# Patient Record
Sex: Female | Born: 1940 | Race: White | Hispanic: No | Marital: Married | Smoking: Never smoker
Health system: Southern US, Community
[De-identification: ages and names within clinical notes are randomized; demographics above are authoritative.]

## PROBLEM LIST (undated history)

## (undated) DIAGNOSIS — R7303 Prediabetes: Secondary | ICD-10-CM

## (undated) DIAGNOSIS — K219 Gastro-esophageal reflux disease without esophagitis: Secondary | ICD-10-CM

## (undated) DIAGNOSIS — C801 Malignant (primary) neoplasm, unspecified: Secondary | ICD-10-CM

## (undated) DIAGNOSIS — E039 Hypothyroidism, unspecified: Secondary | ICD-10-CM

## (undated) DIAGNOSIS — F32A Depression, unspecified: Secondary | ICD-10-CM

## (undated) DIAGNOSIS — E876 Hypokalemia: Secondary | ICD-10-CM

## (undated) DIAGNOSIS — I10 Essential (primary) hypertension: Secondary | ICD-10-CM

## (undated) DIAGNOSIS — M810 Age-related osteoporosis without current pathological fracture: Secondary | ICD-10-CM

## (undated) DIAGNOSIS — I499 Cardiac arrhythmia, unspecified: Secondary | ICD-10-CM

## (undated) DIAGNOSIS — I4891 Unspecified atrial fibrillation: Secondary | ICD-10-CM

## (undated) DIAGNOSIS — IMO0002 Reserved for concepts with insufficient information to code with codable children: Secondary | ICD-10-CM

## (undated) DIAGNOSIS — M1990 Unspecified osteoarthritis, unspecified site: Secondary | ICD-10-CM

## (undated) DIAGNOSIS — E785 Hyperlipidemia, unspecified: Secondary | ICD-10-CM

## (undated) DIAGNOSIS — E871 Hypo-osmolality and hyponatremia: Secondary | ICD-10-CM

## (undated) DIAGNOSIS — T8859XA Other complications of anesthesia, initial encounter: Secondary | ICD-10-CM

## (undated) DIAGNOSIS — F329 Major depressive disorder, single episode, unspecified: Secondary | ICD-10-CM

## (undated) DIAGNOSIS — G8929 Other chronic pain: Secondary | ICD-10-CM

## (undated) DIAGNOSIS — D649 Anemia, unspecified: Secondary | ICD-10-CM

## (undated) HISTORY — PX: BREAST CYST EXCISION: SHX579

## (undated) HISTORY — PX: JOINT REPLACEMENT: SHX530

## (undated) HISTORY — PX: APPENDECTOMY: SHX54

## (undated) HISTORY — PX: CHOLECYSTECTOMY: SHX55

## (undated) HISTORY — PX: TENDON REPAIR: SHX5111

## (undated) HISTORY — PX: COLONOSCOPY: SHX174

---

## 1975-03-21 HISTORY — PX: BACK SURGERY: SHX140

## 1988-03-20 HISTORY — PX: ROTATOR CUFF REPAIR: SHX139

## 2000-03-20 HISTORY — PX: FOOT SURGERY: SHX648

## 2000-03-20 HISTORY — PX: OTHER SURGICAL HISTORY: SHX169

## 2006-09-06 ENCOUNTER — Ambulatory Visit: Payer: Self-pay

## 2007-12-06 ENCOUNTER — Encounter: Payer: Self-pay | Admitting: Orthopedic Surgery

## 2007-12-19 ENCOUNTER — Encounter: Payer: Self-pay | Admitting: Orthopedic Surgery

## 2008-02-18 ENCOUNTER — Encounter: Payer: Self-pay | Admitting: Orthopedic Surgery

## 2010-10-12 ENCOUNTER — Emergency Department: Payer: Self-pay | Admitting: *Deleted

## 2013-05-19 ENCOUNTER — Emergency Department: Payer: Self-pay | Admitting: Emergency Medicine

## 2013-05-19 LAB — COMPREHENSIVE METABOLIC PANEL
ALK PHOS: 85 U/L
ALT: 19 U/L (ref 12–78)
AST: 25 U/L (ref 15–37)
Albumin: 3.2 g/dL — ABNORMAL LOW (ref 3.4–5.0)
Anion Gap: 3 — ABNORMAL LOW (ref 7–16)
BUN: 10 mg/dL (ref 7–18)
Bilirubin,Total: 0.4 mg/dL (ref 0.2–1.0)
CHLORIDE: 98 mmol/L (ref 98–107)
CREATININE: 1.07 mg/dL (ref 0.60–1.30)
Calcium, Total: 7.9 mg/dL — ABNORMAL LOW (ref 8.5–10.1)
Co2: 30 mmol/L (ref 21–32)
EGFR (African American): 60 — ABNORMAL LOW
GFR CALC NON AF AMER: 51 — AB
GLUCOSE: 136 mg/dL — AB (ref 65–99)
Osmolality: 264 (ref 275–301)
Potassium: 3.4 mmol/L — ABNORMAL LOW (ref 3.5–5.1)
SODIUM: 131 mmol/L — AB (ref 136–145)
Total Protein: 7.2 g/dL (ref 6.4–8.2)

## 2013-05-19 LAB — URINALYSIS, COMPLETE
BLOOD: NEGATIVE
Bilirubin,UR: NEGATIVE
Glucose,UR: NEGATIVE mg/dL (ref 0–75)
KETONE: NEGATIVE
Nitrite: NEGATIVE
Ph: 6 (ref 4.5–8.0)
Protein: 30
RBC,UR: NONE SEEN /HPF (ref 0–5)
Specific Gravity: 1.023 (ref 1.003–1.030)
Squamous Epithelial: <1
WBC UR: 227 /HPF (ref 0–5)

## 2013-05-19 LAB — CBC
HCT: 34.6 % — ABNORMAL LOW (ref 35.0–47.0)
HGB: 11.6 g/dL — AB (ref 12.0–16.0)
MCH: 29.6 pg (ref 26.0–34.0)
MCHC: 33.7 g/dL (ref 32.0–36.0)
MCV: 88 fL (ref 80–100)
Platelet: 208 10*3/uL (ref 150–440)
RBC: 3.94 10*6/uL (ref 3.80–5.20)
RDW: 13.9 % (ref 11.5–14.5)
WBC: 9 10*3/uL (ref 3.6–11.0)

## 2013-05-21 LAB — URINE CULTURE

## 2013-07-13 ENCOUNTER — Emergency Department: Payer: Self-pay | Admitting: Emergency Medicine

## 2013-07-13 LAB — URINALYSIS, COMPLETE
BILIRUBIN, UR: NEGATIVE
Bacteria: NONE SEEN
Blood: NEGATIVE
Glucose,UR: NEGATIVE mg/dL (ref 0–75)
Ketone: NEGATIVE
Leukocyte Esterase: NEGATIVE
Nitrite: NEGATIVE
Ph: 6 (ref 4.5–8.0)
Protein: NEGATIVE
Specific Gravity: 1.014 (ref 1.003–1.030)
Squamous Epithelial: NONE SEEN

## 2013-07-13 LAB — COMPREHENSIVE METABOLIC PANEL
ALK PHOS: 80 U/L
AST: 28 U/L (ref 15–37)
Albumin: 3.2 g/dL — ABNORMAL LOW (ref 3.4–5.0)
Anion Gap: 6 — ABNORMAL LOW (ref 7–16)
BILIRUBIN TOTAL: 0.4 mg/dL (ref 0.2–1.0)
BUN: 9 mg/dL (ref 7–18)
CALCIUM: 8.3 mg/dL — AB (ref 8.5–10.1)
CHLORIDE: 103 mmol/L (ref 98–107)
CO2: 30 mmol/L (ref 21–32)
CREATININE: 0.88 mg/dL (ref 0.60–1.30)
GLUCOSE: 138 mg/dL — AB (ref 65–99)
Osmolality: 278 (ref 275–301)
POTASSIUM: 3.1 mmol/L — AB (ref 3.5–5.1)
SGPT (ALT): 18 U/L (ref 12–78)
SODIUM: 139 mmol/L (ref 136–145)
Total Protein: 6.9 g/dL (ref 6.4–8.2)

## 2013-07-13 LAB — CBC
HCT: 35.3 % (ref 35.0–47.0)
HGB: 11.7 g/dL — AB (ref 12.0–16.0)
MCH: 28.6 pg (ref 26.0–34.0)
MCHC: 33.1 g/dL (ref 32.0–36.0)
MCV: 86 fL (ref 80–100)
Platelet: 191 10*3/uL (ref 150–440)
RBC: 4.09 10*6/uL (ref 3.80–5.20)
RDW: 14 % (ref 11.5–14.5)
WBC: 8 10*3/uL (ref 3.6–11.0)

## 2013-08-09 ENCOUNTER — Inpatient Hospital Stay: Payer: Self-pay | Admitting: Internal Medicine

## 2013-08-09 LAB — COMPREHENSIVE METABOLIC PANEL
ALBUMIN: 3.4 g/dL (ref 3.4–5.0)
AST: 32 U/L (ref 15–37)
Alkaline Phosphatase: 75 U/L
Anion Gap: 12 (ref 7–16)
BILIRUBIN TOTAL: 0.7 mg/dL (ref 0.2–1.0)
BUN: 16 mg/dL (ref 7–18)
Calcium, Total: 8.7 mg/dL (ref 8.5–10.1)
Chloride: 105 mmol/L (ref 98–107)
Co2: 22 mmol/L (ref 21–32)
Creatinine: 0.78 mg/dL (ref 0.60–1.30)
EGFR (Non-African Amer.): >60
GLUCOSE: 116 mg/dL — AB (ref 65–99)
Osmolality: 280 (ref 275–301)
POTASSIUM: 3 mmol/L — AB (ref 3.5–5.1)
SGPT (ALT): 18 U/L (ref 12–78)
Sodium: 139 mmol/L (ref 136–145)
TOTAL PROTEIN: 7.1 g/dL (ref 6.4–8.2)

## 2013-08-09 LAB — URINALYSIS, COMPLETE
Bacteria: NONE SEEN
Bilirubin,UR: NEGATIVE
GLUCOSE, UR: NEGATIVE mg/dL (ref 0–75)
NITRITE: NEGATIVE
PH: 5 (ref 4.5–8.0)
Protein: 30
RBC,UR: 8 /HPF (ref 0–5)
SPECIFIC GRAVITY: 1.026 (ref 1.003–1.030)
WBC UR: 6 /HPF (ref 0–5)

## 2013-08-09 LAB — CBC
HCT: 41.7 % (ref 35.0–47.0)
HGB: 13.3 g/dL (ref 12.0–16.0)
MCH: 27.8 pg (ref 26.0–34.0)
MCHC: 32 g/dL (ref 32.0–36.0)
MCV: 87 fL (ref 80–100)
PLATELETS: 218 10*3/uL (ref 150–440)
RBC: 4.78 10*6/uL (ref 3.80–5.20)
RDW: 14.4 % (ref 11.5–14.5)
WBC: 8.5 10*3/uL (ref 3.6–11.0)

## 2013-08-09 LAB — TROPONIN I: Troponin-I: 0.06 ng/mL — ABNORMAL HIGH

## 2013-08-09 LAB — MAGNESIUM: MAGNESIUM: 1.9 mg/dL

## 2013-08-10 LAB — BASIC METABOLIC PANEL
ANION GAP: 5 — AB (ref 7–16)
BUN: 14 mg/dL (ref 7–18)
Calcium, Total: 7.7 mg/dL — ABNORMAL LOW (ref 8.5–10.1)
Chloride: 113 mmol/L — ABNORMAL HIGH (ref 98–107)
Co2: 23 mmol/L (ref 21–32)
Creatinine: 0.87 mg/dL (ref 0.60–1.30)
EGFR (Non-African Amer.): >60
GLUCOSE: 79 mg/dL (ref 65–99)
Osmolality: 281 (ref 275–301)
POTASSIUM: 2.8 mmol/L — AB (ref 3.5–5.1)
Sodium: 141 mmol/L (ref 136–145)

## 2013-08-10 LAB — TROPONIN I
Troponin-I: 0.1 ng/mL — ABNORMAL HIGH
Troponin-I: 0.09 ng/mL — ABNORMAL HIGH

## 2013-08-10 LAB — CBC WITH DIFFERENTIAL/PLATELET
Basophil #: 0 10*3/uL (ref 0.0–0.1)
Basophil %: 0.3 %
Eosinophil #: 0.1 10*3/uL (ref 0.0–0.7)
Eosinophil %: 0.7 %
HCT: 37.2 % (ref 35.0–47.0)
HGB: 11.9 g/dL — ABNORMAL LOW (ref 12.0–16.0)
Lymphocyte #: 2 10*3/uL (ref 1.0–3.6)
Lymphocyte %: 27.8 %
MCH: 28 pg (ref 26.0–34.0)
MCHC: 32 g/dL (ref 32.0–36.0)
MCV: 87 fL (ref 80–100)
Monocyte #: 0.6 x10 3/mm (ref 0.2–0.9)
Monocyte %: 8.5 %
Neutrophil #: 4.6 10*3/uL (ref 1.4–6.5)
Neutrophil %: 62.7 %
Platelet: 199 10*3/uL (ref 150–440)
RBC: 4.25 10*6/uL (ref 3.80–5.20)
RDW: 14.4 % (ref 11.5–14.5)
WBC: 7.3 10*3/uL (ref 3.6–11.0)

## 2013-08-10 LAB — DRUG SCREEN, URINE
Amphetamines, Ur Screen: NEGATIVE (ref ?–1000)
Barbiturates, Ur Screen: NEGATIVE (ref ?–200)
Benzodiazepine, Ur Scrn: NEGATIVE (ref ?–200)
Cannabinoid 50 Ng, Ur ~~LOC~~: NEGATIVE (ref ?–50)
Cocaine Metabolite,Ur ~~LOC~~: NEGATIVE (ref ?–300)
MDMA (Ecstasy)Ur Screen: NEGATIVE (ref ?–500)
Methadone, Ur Screen: NEGATIVE (ref ?–300)
Opiate, Ur Screen: POSITIVE (ref ?–300)
Phencyclidine (PCP) Ur S: NEGATIVE (ref ?–25)
Tricyclic, Ur Screen: POSITIVE (ref ?–1000)

## 2013-08-10 LAB — LIPID PANEL
Cholesterol: 160 mg/dL (ref 0–200)
HDL Cholesterol: 41 mg/dL (ref 40–60)
LDL CHOLESTEROL, CALC: 100 mg/dL (ref 0–100)
Triglycerides: 94 mg/dL (ref 0–200)
VLDL Cholesterol, Calc: 19 mg/dL (ref 5–40)

## 2013-08-10 LAB — POTASSIUM: Potassium: 3.3 mmol/L — ABNORMAL LOW (ref 3.5–5.1)

## 2013-08-10 LAB — TSH: Thyroid Stimulating Horm: 0.21 u[IU]/mL — ABNORMAL LOW

## 2013-08-11 LAB — URINE CULTURE

## 2013-08-14 LAB — CULTURE, BLOOD (SINGLE)

## 2013-08-20 ENCOUNTER — Inpatient Hospital Stay: Payer: Self-pay | Admitting: Internal Medicine

## 2013-08-20 LAB — COMPREHENSIVE METABOLIC PANEL
ALK PHOS: 89 U/L
Albumin: 3.4 g/dL (ref 3.4–5.0)
Anion Gap: 9 (ref 7–16)
BILIRUBIN TOTAL: 0.6 mg/dL (ref 0.2–1.0)
BUN: 12 mg/dL (ref 7–18)
Calcium, Total: 8.6 mg/dL (ref 8.5–10.1)
Chloride: 97 mmol/L — ABNORMAL LOW (ref 98–107)
Co2: 25 mmol/L (ref 21–32)
Creatinine: 1.29 mg/dL (ref 0.60–1.30)
EGFR (African American): 47 — ABNORMAL LOW
EGFR (Non-African Amer.): 41 — ABNORMAL LOW
GLUCOSE: 137 mg/dL — AB (ref 65–99)
Osmolality: 265 (ref 275–301)
Potassium: 4.3 mmol/L (ref 3.5–5.1)
SGOT(AST): 31 U/L (ref 15–37)
SGPT (ALT): 29 U/L (ref 12–78)
SODIUM: 131 mmol/L — AB (ref 136–145)
TOTAL PROTEIN: 7.2 g/dL (ref 6.4–8.2)

## 2013-08-20 LAB — PHOSPHORUS: PHOSPHORUS: 4 mg/dL (ref 2.5–4.9)

## 2013-08-20 LAB — PROTIME-INR
INR: 1
Prothrombin Time: 12.8 secs (ref 11.5–14.7)

## 2013-08-20 LAB — CBC WITH DIFFERENTIAL/PLATELET
Basophil #: 0.1 10*3/uL (ref 0.0–0.1)
Basophil %: 0.3 %
EOS PCT: 0.2 %
Eosinophil #: 0 10*3/uL (ref 0.0–0.7)
HCT: 41.9 % (ref 35.0–47.0)
HGB: 14 g/dL (ref 12.0–16.0)
Lymphocyte #: 2.3 10*3/uL (ref 1.0–3.6)
Lymphocyte %: 11.2 %
MCH: 28.9 pg (ref 26.0–34.0)
MCHC: 33.5 g/dL (ref 32.0–36.0)
MCV: 86 fL (ref 80–100)
Monocyte #: 1 x10 3/mm — ABNORMAL HIGH (ref 0.2–0.9)
Monocyte %: 4.9 %
Neutrophil #: 17.4 10*3/uL — ABNORMAL HIGH (ref 1.4–6.5)
Neutrophil %: 83.4 %
Platelet: 288 10*3/uL (ref 150–440)
RBC: 4.85 10*6/uL (ref 3.80–5.20)
RDW: 14.5 % (ref 11.5–14.5)
WBC: 20.9 10*3/uL — AB (ref 3.6–11.0)

## 2013-08-20 LAB — URINALYSIS, COMPLETE
BACTERIA: NONE SEEN
BLOOD: NEGATIVE
Bilirubin,UR: NEGATIVE
GLUCOSE, UR: NEGATIVE mg/dL (ref 0–75)
Hyaline Cast: 3
KETONE: NEGATIVE
Leukocyte Esterase: NEGATIVE
NITRITE: NEGATIVE
PH: 8 (ref 4.5–8.0)
Protein: NEGATIVE
RBC,UR: 1 /HPF (ref 0–5)
Specific Gravity: 1.016 (ref 1.003–1.030)
Squamous Epithelial: NONE SEEN

## 2013-08-20 LAB — TROPONIN I: Troponin-I: <0.02 ng/mL

## 2013-08-20 LAB — TSH: THYROID STIMULATING HORM: 0.14 u[IU]/mL — AB

## 2013-08-20 LAB — MAGNESIUM: Magnesium: 1.9 mg/dL

## 2013-08-21 LAB — CBC WITH DIFFERENTIAL/PLATELET
BASOS ABS: 0.1 10*3/uL (ref 0.0–0.1)
Basophil %: 0.3 %
Eosinophil #: 0 10*3/uL (ref 0.0–0.7)
Eosinophil %: 0 %
HCT: 39 % (ref 35.0–47.0)
HGB: 12.6 g/dL (ref 12.0–16.0)
Lymphocyte #: 1.1 10*3/uL (ref 1.0–3.6)
Lymphocyte %: 5.1 %
MCH: 28.2 pg (ref 26.0–34.0)
MCHC: 32.2 g/dL (ref 32.0–36.0)
MCV: 87 fL (ref 80–100)
MONOS PCT: 6.3 %
Monocyte #: 1.4 x10 3/mm — ABNORMAL HIGH (ref 0.2–0.9)
Neutrophil #: 19.4 10*3/uL — ABNORMAL HIGH (ref 1.4–6.5)
Neutrophil %: 88.3 %
Platelet: 187 10*3/uL (ref 150–440)
RBC: 4.46 10*6/uL (ref 3.80–5.20)
RDW: 14.7 % — ABNORMAL HIGH (ref 11.5–14.5)
WBC: 21.9 10*3/uL — AB (ref 3.6–11.0)

## 2013-08-21 LAB — BASIC METABOLIC PANEL
Anion Gap: 7 (ref 7–16)
BUN: 15 mg/dL (ref 7–18)
CHLORIDE: 103 mmol/L (ref 98–107)
CREATININE: 1.3 mg/dL (ref 0.60–1.30)
Calcium, Total: 7.3 mg/dL — ABNORMAL LOW (ref 8.5–10.1)
Co2: 23 mmol/L (ref 21–32)
GFR CALC AF AMER: 47 — AB
GFR CALC NON AF AMER: 40 — AB
Glucose: 165 mg/dL — ABNORMAL HIGH (ref 65–99)
Osmolality: 271 (ref 275–301)
POTASSIUM: 3.7 mmol/L (ref 3.5–5.1)
SODIUM: 133 mmol/L — AB (ref 136–145)

## 2013-08-21 LAB — T4, FREE: Free Thyroxine: 1.63 ng/dL — ABNORMAL HIGH (ref 0.76–1.46)

## 2013-08-22 LAB — URINE CULTURE

## 2013-08-25 LAB — CULTURE, BLOOD (SINGLE)

## 2013-09-04 ENCOUNTER — Ambulatory Visit: Payer: Self-pay | Admitting: Neurology

## 2014-03-20 HISTORY — PX: DENTAL SURGERY: SHX609

## 2014-07-11 NOTE — Discharge Summary (Signed)
PATIENT NAME:  Sara Greene, Sara Greene MR#:  [id number] DATE OF BIRTH:  November 06, 1940  DATE OF ADMISSION:  08/20/2013 DATE OF DISCHARGE:  08/21/2013  ADMISSION DIAGNOSIS: Sepsis.   DISCHARGE DIAGNOSES: 1.  Sepsis meeting criteria, but no source of infection.  2.  Leukocytosis, likely stress related and reactive.  3.  Hyponatremia.  4.  Diarrhea.  5.  Confusion.  6.  Hypothyroidism.   CONSULTATIONS: None.   DIAGNOSTIC DATA: Laboratory: Blood cultures were negative. Urine culture was negative.   Sodium 133, potassium 3.7, chloride 103, bicarb 23, BUN 15, creatinine 1.30, glucose 165. White blood cells 21, hemoglobin 12.6, hematocrit 39, and platelets 287,000.   HOSPITAL COURSE: The patient is a 74 year old female who presented with altered mental status, described as somnolence. No other associated symptoms. For further details, please refer to the H and P.  1.  Sepsis. The patient presented with a low-grade temperature, elevated heart rate and elevated white blood cell count. Her blood cultures and urine cultures were all ordered, all of which were negative. Her UA was also normal. She was placed on antibiotics empirically. Her white blood cell count did go up; however, this is likely secondary to inflammatory process. Her neutrophils were completely normal. I suspect that her sepsis is probably from constipation along with her altered mental status. She was discharged as per the family recommendations for empiric antibiotics, but no source of infection was noted.  2.  Hypernatremia from constipation, p.o. intake. The patient's sodium improved. 3.  Hypothyroidism. Her Synthroid was just recently changed and decreased so I did not make any changes. She will need repeat TFTs in 4 to 6 weeks. 4.  Diarrhea. The patient after using MOM now has some loose stools, and she was given loperamide for this, but the patient initially presented with constipation.  5.  Leukocytosis. This is suspected to be reactive in  nature.   DISCHARGE MEDICATIONS: 1.  Oxybutynin 5 mg daily. 2.  Pravastatin 40 mg daily. 3.  Doxepin 50 mg daily.  4.  Gabapentin 600 mg daily.  5.  Paxil 30 mg daily.  6.  Morphine 30 mg b.i.d.  7.  Ranitidine 150 mg daily.  8.  Omeprazole 20 mg daily. 9.  Calcium plus D 1 tablet daily. 10.  Centrum Silver 1 tablet daily.  11.  Acetaminophen/oxycodone 325/10 four times a day p.r.n.  12.  Synthroid 125 mcg daily.  13.  Lisinopril 5 mg daily.  14.  K-Tab 10 mEq daily. 15.  Aspirin 81 mg daily.  16.  Amoxicillin 1 tablet b.i.d. for 5 days.   DISCHARGE DIET: Low sodium.   DISCHARGE ACTIVITY: As tolerated.   DISCHARGE FOLLOWUP: The patient has a followup on 08/26/2013 with neurology and she will follow up at the end of the week with her primary care physician for repeat CBC as well as she will need thyroid function tests in about 4 weeks.  The patient was stable for discharge. Plan of care was discussed with the family.   TIME SPENT: 35 minutes.    ____________________________ Donell Beers. Benjie Karvonen, MD spm:sb D: 08/21/2013 12:33:44 ET T: 08/21/2013 13:40:57 ET JOB#: [id number]  cc: Matteus Mcnelly P. Benjie Karvonen, MD, <Dictator> Cash Manuella Ghazi, MD Donell Beers Erby Sanderson MD ELECTRONICALLY SIGNED 08/22/2013 12:35

## 2014-07-11 NOTE — H&P (Signed)
PATIENT NAME:  Sara Greene, BRIER MR#:  [id number] DATE OF BIRTH:  02-27-1941  DATE OF ADMISSION:  08/20/2013  REFERRING PHYSICIAN: Paduchowski.   PRIMARY CARE PHYSICIAN: [facility] in [place]: Altered mental status.   HISTORY OF PRESENT ILLNESS: A 74 year old Caucasian female with history of hypertension and hyperlipidemia, presenting with altered mental status. She is unable to provide any meaningful information given current mental status and medical condition. Family at bedside states 1 day duration of altered mental status, described mainly as lethargy and somnolence without associated symptoms, including chest pain, palpitations, shortness of breath, cough, fevers, chills, dysuria, diarrhea. She was recently discharged from [facility] with discharge diagnosis of encephalopathy, most likely secondary to medications which were addressed. She was also noted to have a urinary tract infection with Strep agalactiae or group B strep.   REVIEW OF SYSTEMS: Unobtainable given the patient's mental status and medical condition.   PAST MEDICAL HISTORY: Drug-induced lupus, hypothyroidism, hyperlipidemia, hypertension, depression.   SOCIAL HISTORY: No documentation of alcohol, tobacco or drug usage.   FAMILY HISTORY: Positive for CVA, diabetes, as well as coronary artery disease.   ALLERGIES: LIPITOR AND SHELLFISH.   HOME MEDICATIONS: Include acetaminophen/oxycodone 325/10 mg p.o. 4 times daily as needed for pain, morphine 30 mg p.o. b.i.d., gabapentin 600 mg p.o. daily, paroxetine 30 mg p.o. daily, fluconazole 100 mg p.o. 3 times daily (she has recently finished this course), pravastatin 40 mg p.o. daily, doxepin 50 mg p.o. daily, ranitidine 150 mg p.o. daily, potassium 10 mEq p.o. b.i.d., Prilosec 20 mg p.o. daily, levothyroxine 125 mcg p.o. daily, oxybutynin 5 mg p.o. daily, calcium 500 plus D 1 tablet daily, multivitamin 1 tablet daily.   PHYSICAL EXAMINATION:  VITAL SIGNS:  Temperature 100.6 degrees Fahrenheit, heart rate 127, respirations 16, blood pressure on arrival 72/42, currently 99/54, saturating 97% on room air.  GENERAL: Well-nourished Caucasian female, currently in moderate distress secondary to mental status.  HEAD: Normocephalic, atraumatic.  EYES: Pupils equal, round and reactive to light. Extraocular muscles intact. No scleral icterus.  MOUTH: Dry mucosal membranes. Dentition intact. No abscess noted.  EARS, NOSE, THROAT: Clear without exudates.  No external lesions.  NECK: Supple. No thyromegaly. No nodules. No JVD.   PULMONARY: Clear to auscultation bilaterally without wheezes, rubs or rhonchi. No use of accessory muscles. Good respiratory effort.  CHEST: Nontender to palpation.  CARDIOVASCULAR: S1, S2, tachycardic. No murmurs, rubs or gallops. No edema. Pedal pulses 2+ bilaterally.   GASTROINTESTINAL: Soft, nontender, nondistended. No masses. Positive bowel sounds. No hepatosplenomegaly.   MUSCULOSKELETAL: No swelling, clubbing, edema. Range of motion full in all extremities.  NEUROLOGIC: Unable to fully assess given the patient's current mental status. She is easily arousable to verbal stimuli; however, falls back to sleep. Unable to follow commands in any significant capacity. She does have spontaneous movements of all extremities.  SKIN: No ulcerations, lesions, rash or cyanosis. Skin warm, dry. Turgor intact.  PSYCHIATRIC: Unable to fully assess given the patient's current mental status and medical condition. She is rather somnolent. She does awake to verbal stimuli; however, unable to have a meaningful conversation.   LABORATORY DATA: Sodium 131, potassium 4.3, chloride 97, bicarb 25, BUN 12, creatinine 1.29, glucose 137. LFTs within normal limits. Troponin less than 0.02. WBC 20.9, hemoglobin 14, platelets of 288. Urinalysis negative for evidence of infection. Chest x-ray performed, reveals no acute cardiopulmonary process.   ASSESSMENT AND  PLAN: A 74 year old Caucasian female with history of hypertension and hyperlipidemia, presenting  with altered mental status.  1. Sepsis: Meeting septic criteria by temperature, heart rate and leukocytosis. Panculture, including blood and urine. Antibiotic coverage with vancomycin and Zosyn. Follow culture data. Downgrade antibiotics when able to. Intravenous fluid hydration to keep mean arterial pressure greater than 65.  2. Hyponatremia: Intravenous fluid hydration with normal saline. Follow sodium levels.  3. Hypothyroidism: Check a TSH, as well as continue with Synthroid.  4. Gastroesophageal reflux disease: Continue with proton pump inhibitor therapy.  5. Venous thromboembolism prophylaxis with heparin subcutaneous.   The patient is DNR as discussed with the husband and daughter at bedside.   TIME SPENT: 45 minutes.   ____________________________ Aaron Mose. Hower, MD dkh:gb D: 08/20/2013 58:59:29 ET T: 08/20/2013 23:51:37 ET JOB#: [id number]  cc: Aaron Mose. Hower, MD, <Dictator> DAVID Woodfin Ganja MD ELECTRONICALLY SIGNED 08/21/2013 1:17

## 2014-07-11 NOTE — H&P (Signed)
PATIENT NAME:  Sara, Greene MR#:  [id number] DATE OF BIRTH:  03-Jan-1941  DATE OF ADMISSION:  08/09/2013  CHIEF COMPLAINT: "I was brought here by my family."   HISTORY OF PRESENT ILLNESS: This is a 74 year old female who has refused to come to the ER for the past 2 days, was brought in by her family. She states that she feels ad .This started on Wednesday where she was not talking. She was not taking her medications. On Thursday she did not eat or drink anything. On Friday she could not finish her thought. They did get a small amount of Gatorade in her. They did give her to 2 aspirin last night. She has been feeling very cold and she had a fever at home of 99.4. In the ER she does answer questions. Hospitalist services were contacted for further evaluation.   Of note, the patient is on chronic pain medication and her last Percocet was on Wednesday, and unable to get any other pain medications or other medications in her. She also does take Paxil and doxepin.   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   PAST SURGICAL HISTORY: Right foot construction surgery, back surgery, cholecystectomy, appendectomy, rotator cuff surgery.   ALLERGIES: Lipitor.   MEDICATIONS: As per patient's list include levothyroxine 137 mcg daily, oxybutynin 5 mg daily, atenolol 50 mg daily, pravastatin 40 mg daily, doxepin 50 mg daily, gabapentin 600 mg daily, Paxil 30 mg daily, alendronate 70 mg once a week, morphine ER, MS Contin 30 mg b.i.d., ranitidine 150 mg daily, omeprazole 20 mg daily, Percocet 5/325 as needed for pain, calcium carbonate with vitamin D twice a day, Centrum Silver daily.   SOCIAL HISTORY: No smoking. No alcohol. No drug use. Used to work on a farm, in a day care, Optometrist, Network engineer.   FAMILY HISTORY: Father died of a CVA, also had skin cancer. Mother died of a myocardial infarction, had diabetes.   REVIEW OF  SYSTEMS: CONSTITUTIONAL: Positive for fever. Positive for chills. Positive for sweats. No weight loss. No weight gain. No weakness or fatigue.  EYES: No blurry vision.  Ears, nose, mouth and throat: No hearing loss. No sore throat. No difficulty swallowing.  CARDIOVASCULAR: No chest pain. No palpitations.  RESPIRATORY: No shortness of breath. No cough.  GASTROINTESTINAL: Positive for dry heaves, poor appetite. No abdominal pain. Positive for constipation, but today she did have a bowel movement. No bright red blood per rectum. No melena.  GENITOURINARY: No burning on urination. No hematuria.  MUSCULOSKELETAL: Positive for chronic pain in the low back.  INTEGUMENT: No rashes or eruptions.  NEUROLOGIC: No fainting or blackouts.  PSYCHIATRIC: Positive for anxiety and depression.  ENDOCRINE: Positive for hypothyroidism.  Hematologic and lymphatic: No anemia.   PHYSICAL EXAMINATION:  VITAL SIGNS: Temperature 97.5, pulse 62, respirations 20, blood pressure 164/74, pulse oximetry 100% on room air.  GENERAL: No respiratory distress.  EYES: Conjunctivae and lids normal. Pupils equal, round, and reactive to light. Extraocular muscles intact. No nystagmus.  Ears, nose, mouth and throat: Tympanic membranes: No erythema. Nasal mucosa: No erythema. Throat: No erythema. No exudate seen. Lips and gums: No lesions.  NECK: No JVD. No bruits. No lymphadenopathy. No thyromegaly. No thyroid nodules palpated.  LUNGS: Clear to auscultation. No use of accessory muscles to breathe. No rhonchi, rales, or wheeze heard.  CARDIOVASCULAR: S1, S2 normal. No gallops, rubs, or murmurs heard. Carotid upstroke 2+ bilaterally. No bruits. Dorsalis pedis pulses 2+ bilaterally.  Edema 1+ of the lower extremity.  ABDOMEN: Soft, nontender. No organosplenomegaly. Normoactive bowel sounds. No masses felt.  LYMPHATIC: No lymph nodes in the neck.  MUSCULOSKELETAL: No clubbing. Trace edema. No cyanosis.  SKIN: No ulcers or lesions seen.   NEUROLOGIC: Cranial nerves II through XII grossly intact. Deep tendon reflexes 2+ bilateral lower extremities.  PSYCHIATRIC: The patient is oriented to person, slight confusion and slight slurring of words.   LABORATORY AND RADIOLOGICAL DATA: Urinalysis 1+ leukocyte esterase. Chest x-ray negative. Troponin borderline at 0.06. White blood cell count 8.5, hemoglobin and hematocrit 13.3 and 41.7, platelet count of 218. Glucose 116, BUN 16, creatinine 0.78, sodium 139, potassium 3.0, chloride 105, CO2 of 22, calcium 8.7. Liver function tests normal range.   ASSESSMENT AND PLAN:  1.  Acute encephalopathy. I do think this is probably a withdrawal from the stopping of pain medications and her selective serotonin reuptake inhibitor abruptly. I will give a stat dose of Paxil 30 mg and continue that daily. We will give a dose of stat IV morphine 2 mg IV stat and continue her Percocet as needed and MS Contin at a lower dose. This could also be infection-related with a slightly positive urinalysis. The patient has been on Cipro recently. I will give IV Rocephin and send off a urine culture. Could also be a stroke. We will get a stat CT scan of the head: Family gave aspirin yesterday. We will continue aspirin on a daily basis. Could also be dehydration. We will give IV fluid hydration and continue to monitor her mental status. Can consider an MRI of the brain if the patient's symptoms do not improve by tomorrow.  2.  Hypokalemia. Will check a magnesium and replace that if low. We will replace potassium orally and in IV fluids.  3.  Poor appetite with history of esophagitis as per family. Will start IV Protonix and give empiric Diflucan to see if this improves the patient's appetite. The patient did have a recent endoscopy.  4.  Hypertension. Heart rate on the lower side. Will stop atenolol. Continue to monitor off medication at this point. Can consider Norvasc or another agent if blood pressure remains high.  5.   Hypothyroidism. Restart levothyroxine and check a TSH in the a.m.  6.  Hyperlipidemia. Hold statin at this point.  7.  Elevated troponin. Will monitor on telemetry. Continue aspirin and check serial cardiac enzymes. I think this is likely secondary to other processes going on rather than cardiac issue.  The patient is a DO NOT RESUSCITATE.  Time Spent on admission: 55 minutes.    ____________________________ Tana Conch. Leslye Peer, MD rjw:de D: 08/09/2013 18:49:43 ET T: 08/09/2013 20:47:58 ET JOB#: [id number]  cc: Tana Conch. Leslye Peer, MD, <Dictator> Marisue Brooklyn MD ELECTRONICALLY SIGNED 08/10/2013 10:27

## 2014-07-11 NOTE — Discharge Summary (Signed)
PATIENT NAME:  Sara Greene, Sara Greene MR#:  [id number] DATE OF BIRTH:  [birth year]  DATE OF ADMISSION:  08/09/2013 DATE OF DISCHARGE:  08/10/2013  DISCHARGE DIAGNOSES:  1. Acute encephalopathy, now resolved, possibly due to pain medicine withdrawal or too thyroid replacement. Certainly, could have onset of dementia.  2. Hypokalemia, repleted.  3. Poor appetite with history of esophagitis. Started on Diflucan empirically for thrush.  4. Bradycardia. Stopped atenolol. Started on lisinopril for blood pressure control.  5. Hypothyroidism with low TSH. Cutting back on Synthroid dose. Recommend rechecking TSH in 4 to 6 weeks as an outpatient.  6. Elevated troponin due to supply-demand ischemia.   SECONDARY DIAGNOSES:  1. Lupus from Lipitor.  2. Hypothyroidism.  3. Hyperlipidemia.  4. Hypertension.  5. Chronic pain.  6. Depression.   CONSULTATION: None.   PROCEDURES AND RADIOLOGY: Chest x-ray on the 23rd of May showed no acute cardiopulmonary disease.   CT scan of the head without contrast on the 23rd of May showed no acute abnormality.   MAJOR LABORATORY PANEL: UA on admission showed 6 WBCs and 1+ leuk esterase, otherwise negative.   Blood cultures x 2 were negative.   Urine culture grew 5000 colonies of gram-positive cocci.   HISTORY AND SHORT HOSPITAL COURSE: The patient is a 74 year old female with above-mentioned medical problems who was admitted for some altered mental status, mainly some difficulty remembering things. Please see Dr. Glade Stanford dictated history and physical for further details. There was concern for possible urinary tract infection. She also had low potassium which was repleted. She was started on IV antibiotics, although her urine culture remained negative so antibiotics were stopped. On further lab work evaluation, she was found to have a low TSH suggestive over replacement of thyroid hormone, and her Synthroid dose was reduced. The patient was also found to have  borderline troponin which was thought to be due to supply-demand ischemia. She was found to be bradycardic for which atenolol was stopped and was started on lisinopril for better blood pressure control. After discussion with family, the patient was thought to be back to her baseline. They also recommended/requested a new primary care physician which was assigned to her and was discharged home in stable condition.   VITAL SIGNS: On the date of discharge, her vital signs were as follows: Temperature 98.4, heart rate 55 per minute, respirations 20 per minute, blood pressure 146/72 mmHg. She was saturating 98% on room air.   PERTINENT PHYSICAL EXAMINATION ON THE DATE OF DISCHARGE:  CARDIOVASCULAR: S1, S2 normal. No murmurs, rubs or gallop.  LUNGS: Clear to auscultation bilaterally. No wheezing, rales, rhonchi or crepitation.  ABDOMEN: Soft, benign.  NEUROLOGIC: Nonfocal examination.   All other physical examination remained at baseline.   DISCHARGE MEDICATIONS:  1. Oxybutynin 5 mg p.o. daily.  2. Pravastatin 40 mg p.o. daily.  3. Doxepin 50 mg p.o. daily.  4. Gabapentin 600 mg p.o. daily.  5. Paroxetine 30 mg p.o. daily.  6. Morphine 30 mg/12 hour 1 tablet p.o. b.i.d.  7. Ranitidine 150 mg p.o. daily.  8. Omeprazole 20 mg p.o. daily.  9. Calcium with vitamin D 1 tablet p.o. daily.  10. Centrum Silver once daily.  11. Acetaminophen/oxycodone 325/10 mg 1 tablet p.o. 4 times a day as needed.  12. Levothyroxine 125 mcg p.o. daily.  13. Diflucan 100 mg p.o. daily for 3 more days.  14. Lisinopril 5 mg p.o. daily.  15. KDur tablet 10 mEq p.o. b.i.d.   DISCHARGE DIET: Low sodium.  DISCHARGE ACTIVITY: As tolerated.   DISCHARGE INSTRUCTIONS AND FOLLOWUP:  1. The patient was instructed to follow up with her new primary care physician, Dr. Deborra Medina or Ronette Deter, at [facility] in [place] where she can see at earliest available appointment.  2. The patient will need to follow  up with [facility], Dr. Jennings Books, in 2 to 4 weeks and with pain management doctor as scheduled in coming weeks.   TOTAL TIME DISCHARGING THIS PATIENT: 55 minutes.   ____________________________ Lucina Mellow. Manuella Ghazi, MD vss:gb D: 08/10/2013 16:21:46 ET T: 08/11/2013 02:25:33 ET JOB#: [id number]  cc: Mintie Witherington S. Manuella Ghazi, MD, <Dictator> Deborra Medina, MD Hemang K. Manuella Ghazi, [place] MD ELECTRONICALLY SIGNED 08/12/2013 20:55

## 2014-07-31 ENCOUNTER — Other Ambulatory Visit: Payer: Self-pay | Admitting: Family Medicine

## 2014-07-31 DIAGNOSIS — Z1231 Encounter for screening mammogram for malignant neoplasm of breast: Secondary | ICD-10-CM

## 2014-08-12 ENCOUNTER — Ambulatory Visit
Admission: RE | Admit: 2014-08-12 | Discharge: 2014-08-12 | Disposition: A | Discharge status: Home / Self Care | Payer: Medicare Other | Source: Ambulatory Visit | Attending: Family Medicine | Admitting: Family Medicine

## 2014-08-12 ENCOUNTER — Other Ambulatory Visit: Payer: Self-pay | Admitting: Family Medicine

## 2014-08-12 DIAGNOSIS — Z1231 Encounter for screening mammogram for malignant neoplasm of breast: Secondary | ICD-10-CM

## 2014-10-14 ENCOUNTER — Encounter: Payer: Self-pay | Admitting: *Deleted

## 2014-10-15 ENCOUNTER — Ambulatory Visit: Payer: Medicare Other | Admitting: Anesthesiology

## 2014-10-15 ENCOUNTER — Encounter
Admission: RE | Disposition: A | Discharge status: Home / Self Care | Payer: Self-pay | Source: Ambulatory Visit | Attending: Gastroenterology

## 2014-10-15 ENCOUNTER — Ambulatory Visit
Admission: RE | Admit: 2014-10-15 | Discharge: 2014-10-15 | Disposition: A | Discharge status: Home / Self Care | Payer: Medicare Other | Source: Ambulatory Visit | Attending: Gastroenterology | Admitting: Gastroenterology

## 2014-10-15 DIAGNOSIS — I10 Essential (primary) hypertension: Secondary | ICD-10-CM | POA: Diagnosis not present

## 2014-10-15 DIAGNOSIS — E039 Hypothyroidism, unspecified: Secondary | ICD-10-CM | POA: Diagnosis not present

## 2014-10-15 DIAGNOSIS — Z91013 Allergy to seafood: Secondary | ICD-10-CM | POA: Diagnosis not present

## 2014-10-15 DIAGNOSIS — E785 Hyperlipidemia, unspecified: Secondary | ICD-10-CM | POA: Insufficient documentation

## 2014-10-15 DIAGNOSIS — Z888 Allergy status to other drugs, medicaments and biological substances status: Secondary | ICD-10-CM | POA: Diagnosis not present

## 2014-10-15 DIAGNOSIS — M810 Age-related osteoporosis without current pathological fracture: Secondary | ICD-10-CM | POA: Diagnosis not present

## 2014-10-15 DIAGNOSIS — Z79891 Long term (current) use of opiate analgesic: Secondary | ICD-10-CM | POA: Diagnosis not present

## 2014-10-15 DIAGNOSIS — Z79899 Other long term (current) drug therapy: Secondary | ICD-10-CM | POA: Diagnosis not present

## 2014-10-15 DIAGNOSIS — I4891 Unspecified atrial fibrillation: Secondary | ICD-10-CM | POA: Diagnosis not present

## 2014-10-15 DIAGNOSIS — Z1211 Encounter for screening for malignant neoplasm of colon: Secondary | ICD-10-CM | POA: Insufficient documentation

## 2014-10-15 HISTORY — DX: Major depressive disorder, single episode, unspecified: F32.9

## 2014-10-15 HISTORY — DX: Age-related osteoporosis without current pathological fracture: M81.0

## 2014-10-15 HISTORY — DX: Essential (primary) hypertension: I10

## 2014-10-15 HISTORY — DX: Hyperlipidemia, unspecified: E78.5

## 2014-10-15 HISTORY — PX: COLONOSCOPY WITH PROPOFOL: SHX5780

## 2014-10-15 HISTORY — DX: Depression, unspecified: F32.A

## 2014-10-15 HISTORY — DX: Gastro-esophageal reflux disease without esophagitis: K21.9

## 2014-10-15 HISTORY — DX: Hypothyroidism, unspecified: E03.9

## 2014-10-15 HISTORY — DX: Cardiac arrhythmia, unspecified: I49.9

## 2014-10-15 SURGERY — COLONOSCOPY WITH PROPOFOL
Anesthesia: General

## 2014-10-15 MED ORDER — ONDANSETRON HCL 4 MG/2ML IJ SOLN
INTRAMUSCULAR | Status: AC
  Filled 2014-10-15: qty 2

## 2014-10-15 MED ORDER — PROPOFOL 10 MG/ML IV BOLUS: INTRAVENOUS | Status: DC | PRN

## 2014-10-15 MED ORDER — PROPOFOL 10 MG/ML IV BOLUS
INTRAVENOUS | Status: DC | PRN
  Administered 2014-10-15 (×5): 20 mg via INTRAVENOUS

## 2014-10-15 MED ORDER — MIDAZOLAM HCL 2 MG/2ML IJ SOLN
INTRAMUSCULAR | Status: DC | PRN
  Administered 2014-10-15: 1 mg via INTRAVENOUS

## 2014-10-15 MED ORDER — SODIUM CHLORIDE 0.9 % IV SOLN
INTRAVENOUS | Status: DC
Start: 2014-10-15 — End: 2014-10-15
  Administered 2014-10-15: 1000 mL via INTRAVENOUS

## 2014-10-15 MED ORDER — ONDANSETRON HCL 4 MG/2ML IJ SOLN: 4.0000 mg | Freq: Once | INTRAMUSCULAR | Status: DC

## 2014-10-15 NOTE — H&P (Signed)
  Date of Initial H&P:10/14/2014  History reviewed, patient examined, no change in status, stable for surgery.

## 2014-10-15 NOTE — Anesthesia Preprocedure Evaluation (Signed)
Anesthesia Evaluation  Patient identified by MRN, date of birth, ID band Patient awake    Reviewed: Allergy & Precautions, H&P , NPO status , Patient's Chart, lab work & pertinent test results, reviewed documented beta blocker date and time   Airway Mallampati: II  TM Distance: >3 FB Neck ROM: full    Dental no notable dental hx.    Pulmonary neg pulmonary ROS,  breath sounds clear to auscultation  Pulmonary exam normal       Cardiovascular Exercise Tolerance: Good hypertension, negative cardio ROS  + dysrhythmias Atrial Fibrillation Rhythm:regular Rate:Normal     Neuro/Psych negative neurological ROS  negative psych ROS   GI/Hepatic negative GI ROS, Neg liver ROS, GERD-  ,  Endo/Other  negative endocrine ROSHypothyroidism   Renal/GU negative Renal ROS  negative genitourinary   Musculoskeletal   Abdominal   Peds  Hematology negative hematology ROS (+)   Anesthesia Other Findings   Reproductive/Obstetrics negative OB ROS                             Anesthesia Physical Anesthesia Plan  ASA: III  Anesthesia Plan: General   Post-op Pain Management:    Induction:   Airway Management Planned:   Additional Equipment:   Intra-op Plan:   Post-operative Plan:   Informed Consent: I have reviewed the patients History and Physical, chart, labs and discussed the procedure including the risks, benefits and alternatives for the proposed anesthesia with the patient or authorized representative who has indicated his/her understanding and acceptance.   Dental Advisory Given  Plan Discussed with: CRNA  Anesthesia Plan Comments:         Anesthesia Quick Evaluation

## 2014-10-15 NOTE — Op Note (Signed)
[facility] Patient Name: Sara Greene Procedure Date: 10/15/2014 10:46 AM MRN: [id number] Account #: [id number] Date of Birth: [birth year] Admit Type: Outpatient Age: 74 Room: [facility] ENDO ROOM 4 Gender: Female Note Status: Finalized Procedure:         Colonoscopy Indications:       Screening for colorectal malignant neoplasm Providers:         Lupita Dawn. Candace Cruise, MD Referring MD:      Dion Body (Referring MD) Medicines:         Monitored Anesthesia Care Complications:     No immediate complications. Procedure:         Pre-Anesthesia Assessment:                    - Prior to the procedure, a History and Physical was                     performed, and patient medications, allergies and                     sensitivities were reviewed. The patient's tolerance of                     previous anesthesia was reviewed.                    - The risks and benefits of the procedure and the sedation                     options and risks were discussed with the patient. All                     questions were answered and informed consent was obtained.                    - After reviewing the risks and benefits, the patient was                     deemed in satisfactory condition to undergo the procedure.                    After obtaining informed consent, the colonoscope was                     passed under direct vision. Throughout the procedure, the                     patient's blood pressure, pulse, and oxygen saturations                     were monitored continuously. The Colonoscope was                     introduced through the anus and advanced to the the cecum,                     identified by appendiceal orifice and ileocecal valve. The                     colonoscopy was performed without difficulty. The patient                     tolerated the procedure well. The quality of the bowel  preparation was fair. Findings:      The  colon (entire examined portion) appeared normal. Impression:        - The entire examined colon is normal.                    - No specimens collected. Recommendation:    - The findings and recommendations were discussed with the                     patient.                    - Discharge patient to home.                    - The findings and recommendations were discussed with the                     patient. Procedure Code(s): --- Professional ---                    [phone], Colonoscopy, flexible; diagnostic, including                     collection of specimen(s) by brushing or washing, when                     performed (separate procedure) Diagnosis Code(s): --- Professional ---                    Z12.11, Encounter for screening for malignant neoplasm of                     colon CPT copyright 2014 American Medical Association. All rights reserved. The codes documented in this report are preliminary and upon coder review may  be revised to meet current compliance requirements. Hulen Luster, MD 10/15/2014 11:09:24 AM This report has been signed electronically. Number of Addenda: 0 Note Initiated On: 10/15/2014 10:46 AM Scope Withdrawal Time: 0 hours 8 minutes 50 seconds  Total Procedure Duration: 0 hours 12 minutes 40 seconds       [facility]

## 2014-10-15 NOTE — Transfer of Care (Signed)
Immediate Anesthesia Transfer of Care Note  Patient: Sara Greene  Procedure(s) Performed: Procedure(s): COLONOSCOPY WITH PROPOFOL (N/A)  Patient Location: PACU and Endoscopy Unit  Anesthesia Type:General  Level of Consciousness: awake  Airway & Oxygen Therapy: Patient Spontanous Breathing  Post-op Assessment: Report given to RN  Post vital signs: stable  Last Vitals:  Filed Vitals:   10/15/14 1109  BP: 114/72  Pulse: 67  Temp: 36.4 C  Resp: 16    Complications: No apparent anesthesia complications

## 2014-10-15 NOTE — Anesthesia Postprocedure Evaluation (Signed)
  Anesthesia Post-op Note  Patient: Sales promotion account executive  Procedure(s) Performed: Procedure(s): COLONOSCOPY WITH PROPOFOL (N/A)  Anesthesia type:General  Patient location: PACU  Post pain: Pain level controlled  Post assessment: Post-op Vital signs reviewed, Patient's Cardiovascular Status Stable, Respiratory Function Stable, Patent Airway and No signs of Nausea or vomiting  Post vital signs: Reviewed and stable  Last Vitals:  Filed Vitals:   10/15/14 1109  BP: 114/72  Pulse: 67  Temp: 36.4 C  Resp: 16    Level of consciousness: awake, alert  and patient cooperative  Complications: No apparent anesthesia complications

## 2014-10-16 ENCOUNTER — Encounter: Payer: Self-pay | Admitting: Gastroenterology

## 2015-03-08 ENCOUNTER — Encounter: Payer: Self-pay | Admitting: Emergency Medicine

## 2015-03-08 ENCOUNTER — Emergency Department: Payer: Medicare Other

## 2015-03-08 ENCOUNTER — Emergency Department
Admission: EM | Admit: 2015-03-08 | Discharge: 2015-03-08 | Disposition: A | Discharge status: Home / Self Care | Payer: Medicare Other | Attending: Emergency Medicine | Admitting: Emergency Medicine

## 2015-03-08 DIAGNOSIS — R2241 Localized swelling, mass and lump, right lower limb: Secondary | ICD-10-CM | POA: Diagnosis not present

## 2015-03-08 DIAGNOSIS — Z79899 Other long term (current) drug therapy: Secondary | ICD-10-CM | POA: Insufficient documentation

## 2015-03-08 DIAGNOSIS — I10 Essential (primary) hypertension: Secondary | ICD-10-CM | POA: Insufficient documentation

## 2015-03-08 DIAGNOSIS — G8929 Other chronic pain: Secondary | ICD-10-CM | POA: Insufficient documentation

## 2015-03-08 DIAGNOSIS — M25561 Pain in right knee: Secondary | ICD-10-CM | POA: Insufficient documentation

## 2015-03-08 DIAGNOSIS — Z7982 Long term (current) use of aspirin: Secondary | ICD-10-CM | POA: Diagnosis not present

## 2015-03-08 DIAGNOSIS — Z79891 Long term (current) use of opiate analgesic: Secondary | ICD-10-CM | POA: Diagnosis not present

## 2015-03-08 MED ORDER — KETOROLAC TROMETHAMINE 30 MG/ML IJ SOLN
30.0000 mg | Freq: Once | INTRAMUSCULAR | Status: AC
  Administered 2015-03-08: 30 mg via INTRAMUSCULAR
  Filled 2015-03-08: qty 1

## 2015-03-08 NOTE — ED Provider Notes (Signed)
Time Seen: Approximately 11:15 I have reviewed the triage notes  Chief Complaint: Knee Pain   History of Present Illness: Sara Greene is a 74 y.o. female who presents with some persistent right knee pain since December 14. She states that she has not had any acute injury though noticed it at that time. He said intermittent sharp pain along with right knee swelling. She states the pain is worse when walking up stairs or ambulatory for significant periods of time. He states at times the pain is severe and she cannot bear weight on her right leg. She is on chronic pain medication for chronic back discomfort and is seen by [facility] in [place]. She morning she went to the [facility] and was referred here for further evaluation. She's noticed some mild swelling diffusely in her right lower extremity. She denies any history of deep venous thrombosis or pulmonary embolism. Past Medical History  Diagnosis Date  . Hypertension   . Hypothyroidism   . Dysrhythmia     a fib  . Osteoporosis   . Depression   . Hyperlipidemia   . GERD (gastroesophageal reflux disease)     There are no active problems to display for this patient.   Past Surgical History  Procedure Laterality Date  . Cholecystectomy    . Back surgery    . Colonoscopy    . Foot surgery    . Appendectomy    . Colonoscopy with propofol N/A 10/15/2014    Procedure: COLONOSCOPY WITH PROPOFOL;  Surgeon: Hulen Luster, MD;  Location: [facility] ENDOSCOPY;  Service: Gastroenterology;  Laterality: N/A;    Past Surgical History  Procedure Laterality Date  . Cholecystectomy    . Back surgery    . Colonoscopy    . Foot surgery    . Appendectomy    . Colonoscopy with propofol N/A 10/15/2014    Procedure: COLONOSCOPY WITH PROPOFOL;  Surgeon: Hulen Luster, MD;  Location: [facility] ENDOSCOPY;  Service: Gastroenterology;  Laterality: N/A;    Current Outpatient Rx  Name  Route  Sig  Dispense  Refill  . aspirin EC 81 MG tablet   Oral   Take  81 mg by mouth daily.         . calcium-vitamin D (OSCAL WITH D) 500-200 MG-UNIT per tablet   Oral   Take 2 tablets by mouth daily.          Marland Kitchen doxepin (SINEQUAN) 50 MG capsule   Oral   Take 50 mg by mouth at bedtime.         Marland Kitchen esomeprazole (NEXIUM) 20 MG capsule   Oral   Take 20 mg by mouth every morning.          . gabapentin (NEURONTIN) 600 MG tablet   Oral   Take 600 mg by mouth every morning.          Marland Kitchen levothyroxine (SYNTHROID, LEVOTHROID) 112 MCG tablet   Oral   Take 112 mcg by mouth daily before breakfast.         . lisinopril (PRINIVIL,ZESTRIL) 5 MG tablet   Oral   Take 5 mg by mouth daily.         Marland Kitchen morphine (MS CONTIN) 30 MG 12 hr tablet   Oral   Take 30 mg by mouth every 12 (twelve) hours.         . Multiple Vitamins-Minerals (CENTRUM SILVER ADULT 50+ PO)   Oral   Take 1 tablet by mouth daily.          Marland Kitchen  omeprazole (PRILOSEC) 20 MG capsule   Oral   Take 20 mg by mouth daily.         Marland Kitchen oxybutynin (DITROPAN) 5 MG tablet   Oral   Take 5 mg by mouth 2 (two) times daily.          Marland Kitchen oxyCODONE-acetaminophen (PERCOCET) 10-325 MG per tablet   Oral   Take 1 tablet by mouth 3 (three) times daily as needed for pain.          Marland Kitchen PARoxetine (PAXIL) 30 MG tablet   Oral   Take 30 mg by mouth every morning.          . potassium chloride (K-DUR,KLOR-CON) 10 MEQ tablet   Oral   Take 10 mEq by mouth daily.          . pravastatin (PRAVACHOL) 40 MG tablet   Oral   Take 40 mg by mouth at bedtime.            Allergies:  Lipitor and Shellfish allergy  Family History: History reviewed. No pertinent family history.  Social History: Social History  Substance Use Topics  . Smoking status: Never Smoker   . Smokeless tobacco: None  . Alcohol Use: None     Review of Systems:   Constitutional: No fever. Patient denies any other joint pain or trauma Eyes: No visual disturbances ENT: No sore throat, ear pain Cardiac: No chest  pain Respiratory: No shortness of breath, wheezing, or stridor Abdomen: No abdominal pain, no vomiting, No diarrhea EExtremities: Patient noted edema exclusively in the right lower extremity cyanosis Skin: No rashes, easy bruising Neurologic: No focal weakness, trouble with speech or swollowing   Physical Exam:  ED Triage Vitals  Enc Vitals Group     BP 03/08/15 0849 126/79 mmHg     Pulse Rate 03/08/15 0849 97     Resp 03/08/15 0849 18     Temp 03/08/15 0849 98.5 F (36.9 C)     Temp Source 03/08/15 0849 Oral     SpO2 03/08/15 0849 100 %     Weight 03/08/15 0849 193 lb (87.544 kg)     Height 03/08/15 0849 5\' 5"  (1.651 m)     Head Cir --      Peak Flow --      Pain Score 03/08/15 0857 8     Pain Loc --      Pain Edu? --      Excl. in [place]? --     General: Awake , Alert , and Oriented times 3; GCS 15 Head: Normal cephalic , atraumatic Eyes: Pupils equal , round, reactive to light Nose/Throat: No nasal drainage, patent upper airway without erythema or exudate.  Neck: Supple, Full range of motion, No anterior adenopathy or palpable thyroid masses Lungs: Clear to ascultation without wheezes , rhonchi, or rales Heart: Regular rate, regular rhythm without murmurs , gallops , or rubs Abdomen: Soft, non tender without rebound, guarding , or rigidity; bowel sounds positive and symmetric in all 4 quadrants. No organomegaly .        Extremities: Examination of the right knee shows it to be stable to Lachman testing. Negative anterior posterior drawer sign. She has reproducible pain over the lateral surface of her joint region. No significant joint effusion, erythema noted. The patient has some mild crepitus underneath the kneecap with palpation but is able to straighten her leg out against resistance. No instability to varus or valgus rotation. Extremities otherwise neurovascularly intact with a  negative Homans sign Neurologic: normal ambulation, Motor symmetric without deficits, sensory  intact Skin: warm, dry, no rashes    Radiology:   CLINICAL DATA: Right knee pain since walking while shopping on 03/03/2015. Initial encounter.  EXAM: RIGHT KNEE - 1-2 VIEW  COMPARISON: None.  FINDINGS: No acute bony or joint abnormality is identified. No joint effusion is seen. Chondrocalcinosis is noted. Small osteophytes are present about the knee.  IMPRESSION: No acute abnormality.  Mild appearing degenerative change.  Chondrocalcinosis.   Electronically Signed By: Inge Rise M.D. On: 03/08/2015 11:23          US Venous Img Lower Unilateral Right (Final result) Result time: 03/08/15 10:02:09   Final result by Rad Results In Interface (03/08/15 10:02:09)   Narrative:   CLINICAL DATA: Right lower extremity pain and edema for the past 5 days. Evaluate for DVT.  EXAM: RIGHT LOWER EXTREMITY VENOUS DOPPLER ULTRASOUND  TECHNIQUE: Gray-scale sonography with graded compression, as well as color Doppler and duplex ultrasound were performed to evaluate the lower extremity deep venous systems from the level of the common femoral vein and including the common femoral, femoral, profunda femoral, popliteal and calf veins including the posterior tibial, peroneal and gastrocnemius veins when visible. The superficial great saphenous vein was also interrogated. Spectral Doppler was utilized to evaluate flow at rest and with distal augmentation maneuvers in the common femoral, femoral and popliteal veins.  COMPARISON: None.  FINDINGS: Contralateral Common Femoral Vein: Respiratory phasicity is normal and symmetric with the symptomatic side. No evidence of thrombus. Normal compressibility.  Common Femoral Vein: No evidence of thrombus. Normal compressibility, respiratory phasicity and response to augmentation.  Saphenofemoral Junction: No evidence of thrombus. Normal compressibility and flow on color Doppler imaging.  Profunda Femoral Vein: No  evidence of thrombus. Normal compressibility and flow on color Doppler imaging.  Femoral Vein: No evidence of thrombus. Normal compressibility, respiratory phasicity and response to augmentation.  Popliteal Vein: No evidence of thrombus. Normal compressibility, respiratory phasicity and response to augmentation.  Calf Veins: No evidence of thrombus. Normal compressibility and flow on color Doppler imaging.  Superficial Great Saphenous Vein: No evidence of thrombus. Normal compressibility and flow on color Doppler imaging.  Venous Reflux: None.  Other Findings: None.  IMPRESSION: No evidence of DVT within the right lower extremity.        I personally reviewed the radiologic studies     ED Course:  Patient does not appear to have a DVT per ultrasound evaluation and clinically I felt she either had a meniscus injury or osteochondritis. She was given a knee immobilizer and crutches and advised continue with her pain management follow-up and was referred here locally to orthopedics unassigned.    Assessment:  Acute right knee pain    Final Clinical Impression:   Final diagnoses:  Knee pain, acute, right     Plan:  Outpatient management Patient was advised to return immediately if condition worsens. Patient was advised to follow up with their primary care physician or other specialized physicians involved in their outpatient care             Daymon Larsen, MD 03/08/15 1551

## 2015-03-08 NOTE — Discharge Instructions (Signed)
Knee Pain Knee pain is a very common symptom and can have many causes. Knee pain often goes away when you follow your health care provider's instructions for relieving pain and discomfort at home. However, knee pain can develop into a condition that needs treatment. Some conditions may include:  Arthritis caused by wear and tear (osteoarthritis).  Arthritis caused by swelling and irritation (rheumatoid arthritis or gout).  A cyst or growth in your knee.  An infection in your knee joint.  An injury that will not heal.  Damage, swelling, or irritation of the tissues that support your knee (torn ligaments or tendinitis). If your knee pain continues, additional tests may be ordered to diagnose your condition. Tests may include X-rays or other imaging studies of your knee. You may also need to have fluid removed from your knee. Treatment for ongoing knee pain depends on the cause, but treatment may include:  Medicines to relieve pain or swelling.  Steroid injections in your knee.  Physical therapy.  Surgery. HOME CARE INSTRUCTIONS  Take medicines only as directed by your health care provider.  Rest your knee and keep it raised (elevated) while you are resting.  Do not do things that cause or worsen pain.  Avoid high-impact activities or exercises, such as running, jumping rope, or doing jumping jacks.  Apply ice to the knee area:  Put ice in a plastic bag.  Place a towel between your skin and the bag.  Leave the ice on for 20 minutes, 2-3 times a day.  Ask your health care provider if you should wear an elastic knee support.  Keep a pillow under your knee when you sleep.  Lose weight if you are overweight. Extra weight can put pressure on your knee.  Do not use any tobacco products, including cigarettes, chewing tobacco, or electronic cigarettes. If you need help quitting, ask your health care provider. Smoking may slow the healing of any bone and joint problems that you may  have. SEEK MEDICAL CARE IF:  Your knee pain continues, changes, or gets worse.  You have a fever along with knee pain.  Your knee buckles or locks up.  Your knee becomes more swollen. SEEK IMMEDIATE MEDICAL CARE IF:   Your knee joint feels hot to the touch.  You have chest pain or trouble breathing.   This information is not intended to replace advice given to you by your health care provider. Make sure you discuss any questions you have with your health care provider.   Document Released: 01/01/2007 Document Revised: 03/27/2014 Document Reviewed: 10/20/2013 Elsevier Interactive Patient Education [employer].  Please return immediately if condition worsens. Please contact her primary physician or the physician you were given for referral. If you have any specialist physicians involved in her treatment and plan please also contact them. Thank you for using [facility].

## 2015-03-08 NOTE — ED Notes (Signed)
Pt to xray with radiology staff.

## 2015-03-08 NOTE — ED Notes (Signed)
Reports walking around at [employer] on the 14th, states since then having pain and swelling in right knee.  States she is in pain management and has taken morphine and percocet with no relief. +PMS to distal foot.

## 2015-03-08 NOTE — ED Notes (Signed)
Pt returned to her room.

## 2015-04-12 ENCOUNTER — Other Ambulatory Visit: Payer: Self-pay | Admitting: Orthopedic Surgery

## 2015-04-12 DIAGNOSIS — M25561 Pain in right knee: Secondary | ICD-10-CM

## 2015-04-29 ENCOUNTER — Ambulatory Visit
Admission: RE | Admit: 2015-04-29 | Discharge: 2015-04-29 | Disposition: A | Discharge status: Home / Self Care | Payer: Medicare Other | Source: Ambulatory Visit | Attending: Orthopedic Surgery | Admitting: Orthopedic Surgery

## 2015-04-29 DIAGNOSIS — M659 Synovitis and tenosynovitis, unspecified: Secondary | ICD-10-CM | POA: Insufficient documentation

## 2015-04-29 DIAGNOSIS — M25561 Pain in right knee: Secondary | ICD-10-CM | POA: Insufficient documentation

## 2015-04-29 DIAGNOSIS — M1711 Unilateral primary osteoarthritis, right knee: Secondary | ICD-10-CM | POA: Insufficient documentation

## 2015-04-29 DIAGNOSIS — M25461 Effusion, right knee: Secondary | ICD-10-CM | POA: Insufficient documentation

## 2015-06-18 ENCOUNTER — Other Ambulatory Visit: Payer: Self-pay | Admitting: Orthopedic Surgery

## 2015-06-23 ENCOUNTER — Encounter
Admission: RE | Admit: 2015-06-23 | Discharge: 2015-06-23 | Disposition: A | Discharge status: Home / Self Care | Payer: Medicare Other | Source: Ambulatory Visit | Attending: Orthopedic Surgery | Admitting: Orthopedic Surgery

## 2015-06-23 ENCOUNTER — Other Ambulatory Visit: Payer: Medicare Other

## 2015-06-23 DIAGNOSIS — Z01812 Encounter for preprocedural laboratory examination: Secondary | ICD-10-CM | POA: Insufficient documentation

## 2015-06-23 DIAGNOSIS — Z01810 Encounter for preprocedural cardiovascular examination: Secondary | ICD-10-CM | POA: Diagnosis present

## 2015-06-23 HISTORY — DX: Hypo-osmolality and hyponatremia: E87.1

## 2015-06-23 HISTORY — DX: Reserved for concepts with insufficient information to code with codable children: IMO0002

## 2015-06-23 HISTORY — DX: Unspecified atrial fibrillation: I48.91

## 2015-06-23 HISTORY — DX: Hypokalemia: E87.6

## 2015-06-23 HISTORY — DX: Unspecified osteoarthritis, unspecified site: M19.90

## 2015-06-23 LAB — CBC WITH DIFFERENTIAL/PLATELET
BASOS ABS: 0 10*3/uL (ref 0–0.1)
BASOS PCT: 1 %
Eosinophils Absolute: 0.1 10*3/uL (ref 0–0.7)
Eosinophils Relative: 2 %
HEMATOCRIT: 34.2 % — AB (ref 35.0–47.0)
Hemoglobin: 11.7 g/dL — ABNORMAL LOW (ref 12.0–16.0)
Lymphocytes Relative: 20 %
Lymphs Abs: 1.3 10*3/uL (ref 1.0–3.6)
MCH: 29.3 pg (ref 26.0–34.0)
MCHC: 34.3 g/dL (ref 32.0–36.0)
MCV: 85.3 fL (ref 80.0–100.0)
MONO ABS: 0.6 10*3/uL (ref 0.2–0.9)
Monocytes Relative: 9 %
NEUTROS ABS: 4.6 10*3/uL (ref 1.4–6.5)
NEUTROS PCT: 68 %
PLATELETS: 206 10*3/uL (ref 150–440)
RBC: 4 MIL/uL (ref 3.80–5.20)
RDW: 13.4 % (ref 11.5–14.5)
WBC: 6.7 10*3/uL (ref 3.6–11.0)

## 2015-06-23 LAB — SURGICAL PCR SCREEN
MRSA, PCR: NEGATIVE
Staphylococcus aureus: NEGATIVE

## 2015-06-23 LAB — TYPE AND SCREEN
ABO/RH(D): O POS
ANTIBODY SCREEN: NEGATIVE

## 2015-06-23 LAB — BASIC METABOLIC PANEL
ANION GAP: 9 (ref 5–15)
BUN: 9 mg/dL (ref 6–20)
CALCIUM: 8.6 mg/dL — AB (ref 8.9–10.3)
CO2: 24 mmol/L (ref 22–32)
Chloride: 94 mmol/L — ABNORMAL LOW (ref 101–111)
Creatinine, Ser: 1.01 mg/dL — ABNORMAL HIGH (ref 0.44–1.00)
GFR, EST NON AFRICAN AMERICAN: 53 mL/min — AB (ref >60)
GLUCOSE: 89 mg/dL (ref 65–99)
Potassium: 4.4 mmol/L (ref 3.5–5.1)
SODIUM: 127 mmol/L — AB (ref 135–145)

## 2015-06-23 LAB — PROTIME-INR
INR: 0.97
Prothrombin Time: 13.1 seconds (ref 11.4–15.0)

## 2015-06-23 LAB — APTT: APTT: 25 s (ref 24–36)

## 2015-06-23 LAB — ABO/RH: ABO/RH(D): O POS

## 2015-06-23 NOTE — Pre-Procedure Instructions (Signed)
Component Name Value Range  Vent Rate (bpm) 89   PR Interval (msec) 202   QRS Interval (msec) 66   QT Interval (msec) 384   QTc (msec) 467    Result Narrative  Sinus rhythm with frequent premature ventricular complexes Low voltage QRS Inferior infarct (cited on or before 16-Jun-2015) Cannot rule out Anterior infarct (cited on or before 16-Jun-2015) Abnormal ECG When compared with ECG of 16-Jun-2015 15:28, premature ventricular complexes are now present premature atrial complexes are no longer present I reviewed and concur with this report. Electronically signed [id number] MD, Lucianne Muss [id number]) on 06/22/2015 10:49:29 AM   Status Results Details

## 2015-06-23 NOTE — Patient Instructions (Signed)
  Your procedure is scheduled on: 07/08/15 Thurs Report to Day Surgery.2nd floor medical mall To find out your arrival time please call [phone] between 1PM - 3PM on 07/07/15 Wed  Remember: Instructions that are not followed completely may result in serious medical risk, up to and including death, or upon the discretion of your surgeon and anesthesiologist your surgery may need to be rescheduled.    _x___ 1. Do not eat food or drink liquids after midnight. No gum chewing or hard candies.     ____ 2. No Alcohol for 24 hours before or after surgery.   ____ 3. Bring all medications with you on the day of surgery if instructed.    _x___ 4. Notify your doctor if there is any change in your medical condition     (cold, fever, infections).     Do not wear jewelry, make-up, hairpins, clips or nail polish.  Do not wear lotions, powders, or perfumes. You may wear deodorant.  Do not shave 48 hours prior to surgery. Men may shave face and neck.  Do not bring valuables to the hospital.    [facility] is not responsible for any belongings or valuables.               Contacts, dentures or bridgework may not be worn into surgery.  Leave your suitcase in the car. After surgery it may be brought to your room.  For patients admitted to the hospital, discharge time is determined by your                treatment team.   Patients discharged the day of surgery will not be allowed to drive home.   Please read over the following fact sheets that you were given:   MRSA Information   __x__ Take these medicines the morning of surgery with A SIP OF WATER:    1. esomeprazole (NEXIUM) 20 MG capsule  2. gabapentin (NEURONTIN) 600 MG tablet  3. levothyroxine (SYNTHROID, LEVOTHROID) 112 MCG tablet  4.lisinopril (PRINIVIL,ZESTRIL) 5 MG tablet  5.morphine (MS CONTIN) 30 MG 12 hr tablet  6.PARoxetine (PAXIL) 30 MG tablet  ____ Fleet Enema (as directed)   __x__ Use CHG Soap as directed  ____ Use inhalers on  the day of surgery  ____ Stop metformin 2 days prior to surgery    ____ Take 1/2 of usual insulin dose the night before surgery and none on the morning of surgery.   __x__ Stop Coumadin/Plavix/aspirin on stop aspirin 1 week before surgery  _x___ Stop Anti-inflammatories on 1 week before surgery may take Tylenol as needed   ____ Stop supplements until after surgery.    ____ Bring C-Pap to the hospital.

## 2015-06-28 NOTE — Pre-Procedure Instructions (Signed)
Medical clearance in chart.

## 2015-07-08 ENCOUNTER — Inpatient Hospital Stay
Admission: RE | Admit: 2015-07-08 | Discharge: 2015-07-12 | DRG: 470 | Disposition: A | Discharge status: Skilled Nursing Facility | Payer: Medicare Other | Source: Ambulatory Visit | Attending: Orthopedic Surgery | Admitting: Orthopedic Surgery

## 2015-07-08 ENCOUNTER — Encounter: Payer: Self-pay | Admitting: *Deleted

## 2015-07-08 ENCOUNTER — Inpatient Hospital Stay: Payer: Medicare Other | Admitting: Anesthesiology

## 2015-07-08 ENCOUNTER — Inpatient Hospital Stay: Payer: Medicare Other

## 2015-07-08 ENCOUNTER — Encounter
Admission: RE | Disposition: A | Discharge status: Skilled Nursing Facility | Payer: Self-pay | Source: Ambulatory Visit | Attending: Orthopedic Surgery

## 2015-07-08 DIAGNOSIS — E039 Hypothyroidism, unspecified: Secondary | ICD-10-CM | POA: Diagnosis present

## 2015-07-08 DIAGNOSIS — E785 Hyperlipidemia, unspecified: Secondary | ICD-10-CM | POA: Diagnosis present

## 2015-07-08 DIAGNOSIS — D649 Anemia, unspecified: Secondary | ICD-10-CM | POA: Diagnosis not present

## 2015-07-08 DIAGNOSIS — M810 Age-related osteoporosis without current pathological fracture: Secondary | ICD-10-CM | POA: Diagnosis present

## 2015-07-08 DIAGNOSIS — I10 Essential (primary) hypertension: Secondary | ICD-10-CM | POA: Diagnosis present

## 2015-07-08 DIAGNOSIS — Z96659 Presence of unspecified artificial knee joint: Secondary | ICD-10-CM

## 2015-07-08 DIAGNOSIS — M1711 Unilateral primary osteoarthritis, right knee: Principal | ICD-10-CM | POA: Diagnosis present

## 2015-07-08 DIAGNOSIS — M25761 Osteophyte, right knee: Secondary | ICD-10-CM | POA: Diagnosis present

## 2015-07-08 DIAGNOSIS — K219 Gastro-esophageal reflux disease without esophagitis: Secondary | ICD-10-CM | POA: Diagnosis present

## 2015-07-08 DIAGNOSIS — Z96651 Presence of right artificial knee joint: Secondary | ICD-10-CM

## 2015-07-08 DIAGNOSIS — F329 Major depressive disorder, single episode, unspecified: Secondary | ICD-10-CM | POA: Diagnosis present

## 2015-07-08 DIAGNOSIS — F419 Anxiety disorder, unspecified: Secondary | ICD-10-CM | POA: Diagnosis present

## 2015-07-08 HISTORY — PX: TOTAL KNEE ARTHROPLASTY: SHX125

## 2015-07-08 HISTORY — DX: Other chronic pain: G89.29

## 2015-07-08 LAB — CBC
HCT: 33.4 % — ABNORMAL LOW (ref 35.0–47.0)
HEMOGLOBIN: 11.1 g/dL — AB (ref 12.0–16.0)
MCH: 29.2 pg (ref 26.0–34.0)
MCHC: 33.1 g/dL (ref 32.0–36.0)
MCV: 88.4 fL (ref 80.0–100.0)
PLATELETS: 169 10*3/uL (ref 150–440)
RBC: 3.78 MIL/uL — AB (ref 3.80–5.20)
RDW: 13.8 % (ref 11.5–14.5)
WBC: 16.6 10*3/uL — AB (ref 3.6–11.0)

## 2015-07-08 LAB — CREATININE, SERUM
CREATININE: 0.87 mg/dL (ref 0.44–1.00)
GFR calc non Af Amer: >60 mL/min (ref >60)

## 2015-07-08 SURGERY — ARTHROPLASTY, KNEE, TOTAL
Anesthesia: Spinal | Laterality: Right | Wound class: Clean

## 2015-07-08 MED ORDER — SODIUM CHLORIDE 0.9 % IV SOLN: Freq: Once | INTRAVENOUS | Status: DC

## 2015-07-08 MED ORDER — MORPHINE SULFATE ER 30 MG PO TBCR
30.0000 mg | EXTENDED_RELEASE_TABLET | Freq: Two times a day (BID) | ORAL | Status: DC
  Administered 2015-07-08 – 2015-07-12 (×8): 30 mg via ORAL
  Filled 2015-07-08 (×9): qty 1

## 2015-07-08 MED ORDER — ENOXAPARIN SODIUM 30 MG/0.3ML ~~LOC~~ SOLN
30.0000 mg | Freq: Two times a day (BID) | SUBCUTANEOUS | Status: DC
  Administered 2015-07-09 – 2015-07-11 (×5): 30 mg via SUBCUTANEOUS
  Filled 2015-07-08 (×7): qty 0.3

## 2015-07-08 MED ORDER — TETRACAINE HCL 1 % IJ SOLN
INTRAMUSCULAR | Status: DC | PRN
  Administered 2015-07-08: 10 mg via INTRASPINAL

## 2015-07-08 MED ORDER — PHENOL 1.4 % MT LIQD
1.0000 | OROMUCOSAL | Status: DC | PRN
  Filled 2015-07-08: qty 177

## 2015-07-08 MED ORDER — FENTANYL CITRATE (PF) 100 MCG/2ML IJ SOLN: 25.0000 ug | INTRAMUSCULAR | Status: DC | PRN

## 2015-07-08 MED ORDER — PRAVASTATIN SODIUM 20 MG PO TABS
40.0000 mg | ORAL_TABLET | Freq: Every day | ORAL | Status: DC
  Administered 2015-07-09 – 2015-07-11 (×3): 40 mg via ORAL
  Filled 2015-07-08 (×3): qty 2

## 2015-07-08 MED ORDER — LACTATED RINGERS IV SOLN
INTRAVENOUS | Status: DC
  Administered 2015-07-08: 50 mL/h via INTRAVENOUS
  Administered 2015-07-08: 09:00:00 via INTRAVENOUS

## 2015-07-08 MED ORDER — LISINOPRIL 5 MG PO TABS
5.0000 mg | ORAL_TABLET | Freq: Every day | ORAL | Status: DC
  Administered 2015-07-09 – 2015-07-12 (×4): 5 mg via ORAL
  Filled 2015-07-08 (×5): qty 1

## 2015-07-08 MED ORDER — ONDANSETRON HCL 4 MG/2ML IJ SOLN
4.0000 mg | Freq: Four times a day (QID) | INTRAMUSCULAR | Status: DC | PRN
  Administered 2015-07-09 (×2): 4 mg via INTRAVENOUS
  Filled 2015-07-08 (×2): qty 2

## 2015-07-08 MED ORDER — METHOCARBAMOL 1000 MG/10ML IJ SOLN
500.0000 mg | Freq: Four times a day (QID) | INTRAVENOUS | Status: DC | PRN
  Filled 2015-07-08: qty 5

## 2015-07-08 MED ORDER — LEVOTHYROXINE SODIUM 112 MCG PO TABS
112.0000 ug | ORAL_TABLET | Freq: Every day | ORAL | Status: DC
  Administered 2015-07-09 – 2015-07-11 (×3): 112 ug via ORAL
  Filled 2015-07-08 (×4): qty 1

## 2015-07-08 MED ORDER — ADULT MULTIVITAMIN W/MINERALS CH
1.0000 | ORAL_TABLET | Freq: Every day | ORAL | Status: DC
  Administered 2015-07-09 – 2015-07-12 (×4): 1 via ORAL
  Filled 2015-07-08 (×5): qty 1

## 2015-07-08 MED ORDER — HYDROMORPHONE HCL 1 MG/ML IJ SOLN
1.0000 mg | INTRAMUSCULAR | Status: DC | PRN
  Administered 2015-07-08 – 2015-07-09 (×5): 1 mg via INTRAVENOUS
  Filled 2015-07-08 (×5): qty 1

## 2015-07-08 MED ORDER — METOCLOPRAMIDE HCL 10 MG PO TABS: 5.0000 mg | ORAL_TABLET | Freq: Three times a day (TID) | ORAL | Status: DC | PRN

## 2015-07-08 MED ORDER — NEOMYCIN-POLYMYXIN B GU 40-200000 IR SOLN
Status: DC | PRN
  Administered 2015-07-08: 16 mL

## 2015-07-08 MED ORDER — ACETAMINOPHEN 325 MG PO TABS: 650.0000 mg | ORAL_TABLET | Freq: Four times a day (QID) | ORAL | Status: DC | PRN

## 2015-07-08 MED ORDER — OXYBUTYNIN CHLORIDE 5 MG PO TABS
5.0000 mg | ORAL_TABLET | Freq: Two times a day (BID) | ORAL | Status: DC
  Administered 2015-07-08 – 2015-07-12 (×8): 5 mg via ORAL
  Filled 2015-07-08 (×9): qty 1

## 2015-07-08 MED ORDER — DOCUSATE SODIUM 100 MG PO CAPS
100.0000 mg | ORAL_CAPSULE | Freq: Two times a day (BID) | ORAL | Status: DC
  Administered 2015-07-08 – 2015-07-12 (×7): 100 mg via ORAL
  Filled 2015-07-08 (×9): qty 1

## 2015-07-08 MED ORDER — ONDANSETRON HCL 4 MG PO TABS: 4.0000 mg | ORAL_TABLET | Freq: Four times a day (QID) | ORAL | Status: DC | PRN

## 2015-07-08 MED ORDER — SODIUM CHLORIDE 0.9 % IV SOLN
INTRAVENOUS | Status: DC | PRN
  Administered 2015-07-08: 100 mL

## 2015-07-08 MED ORDER — BISACODYL 5 MG PO TBEC
5.0000 mg | DELAYED_RELEASE_TABLET | Freq: Every day | ORAL | Status: DC | PRN
  Administered 2015-07-09 – 2015-07-12 (×2): 5 mg via ORAL
  Filled 2015-07-08 (×2): qty 1

## 2015-07-08 MED ORDER — EPHEDRINE SULFATE 50 MG/ML IJ SOLN
INTRAMUSCULAR | Status: DC | PRN
  Administered 2015-07-08: 10 mg via INTRAVENOUS

## 2015-07-08 MED ORDER — METHOCARBAMOL 500 MG PO TABS
500.0000 mg | ORAL_TABLET | Freq: Four times a day (QID) | ORAL | Status: DC | PRN
  Administered 2015-07-08 – 2015-07-12 (×3): 500 mg via ORAL
  Filled 2015-07-08 (×4): qty 1

## 2015-07-08 MED ORDER — MAGNESIUM HYDROXIDE 400 MG/5ML PO SUSP
30.0000 mL | Freq: Every day | ORAL | Status: DC | PRN
  Filled 2015-07-08: qty 30

## 2015-07-08 MED ORDER — CEFAZOLIN SODIUM-DEXTROSE 2-4 GM/100ML-% IV SOLN
INTRAVENOUS | Status: AC
  Filled 2015-07-08: qty 100

## 2015-07-08 MED ORDER — SODIUM CHLORIDE 0.9 % IV SOLN
10000.0000 ug | INTRAVENOUS | Status: DC | PRN
  Administered 2015-07-08: 25 ug/min via INTRAVENOUS

## 2015-07-08 MED ORDER — CHLORHEXIDINE GLUCONATE 4 % EX LIQD: 1.0000 "application " | Freq: Once | CUTANEOUS | Status: DC

## 2015-07-08 MED ORDER — METOCLOPRAMIDE HCL 5 MG/ML IJ SOLN: 5.0000 mg | Freq: Three times a day (TID) | INTRAMUSCULAR | Status: DC | PRN

## 2015-07-08 MED ORDER — ALUM & MAG HYDROXIDE-SIMETH 200-200-20 MG/5ML PO SUSP: 30.0000 mL | ORAL | Status: DC | PRN

## 2015-07-08 MED ORDER — CEFAZOLIN SODIUM-DEXTROSE 2-4 GM/100ML-% IV SOLN
2.0000 g | INTRAVENOUS | Status: AC
  Administered 2015-07-08: 2 g via INTRAVENOUS

## 2015-07-08 MED ORDER — PHENYLEPHRINE HCL 10 MG/ML IJ SOLN
INTRAMUSCULAR | Status: DC | PRN
  Administered 2015-07-08 (×4): 100 ug via INTRAVENOUS

## 2015-07-08 MED ORDER — SENNA 8.6 MG PO TABS
1.0000 | ORAL_TABLET | Freq: Two times a day (BID) | ORAL | Status: DC
  Administered 2015-07-08 – 2015-07-12 (×7): 8.6 mg via ORAL
  Filled 2015-07-08 (×9): qty 1

## 2015-07-08 MED ORDER — KETOROLAC TROMETHAMINE 15 MG/ML IJ SOLN
15.0000 mg | Freq: Four times a day (QID) | INTRAMUSCULAR | Status: AC
  Administered 2015-07-08 – 2015-07-09 (×4): 15 mg via INTRAVENOUS
  Filled 2015-07-08 (×4): qty 1

## 2015-07-08 MED ORDER — BUPIVACAINE HCL (PF) 0.5 % IJ SOLN
INTRAMUSCULAR | Status: DC | PRN
  Administered 2015-07-08: 2 mL

## 2015-07-08 MED ORDER — MENTHOL 3 MG MT LOZG
1.0000 | LOZENGE | OROMUCOSAL | Status: DC | PRN
Start: 2015-07-08 — End: 2015-07-12
  Filled 2015-07-08: qty 9

## 2015-07-08 MED ORDER — OXYCODONE HCL 5 MG PO TABS
10.0000 mg | ORAL_TABLET | ORAL | Status: DC | PRN
  Administered 2015-07-08: 15 mg via ORAL
  Administered 2015-07-08: 10 mg via ORAL
  Administered 2015-07-09 (×3): 15 mg via ORAL
  Administered 2015-07-10: 10 mg via ORAL
  Administered 2015-07-10: 15 mg via ORAL
  Administered 2015-07-10: 10 mg via ORAL
  Administered 2015-07-10: 5 mg via ORAL
  Administered 2015-07-10 – 2015-07-12 (×10): 15 mg via ORAL
  Filled 2015-07-08 (×3): qty 3
  Filled 2015-07-08: qty 1
  Filled 2015-07-08: qty 2
  Filled 2015-07-08 (×3): qty 3
  Filled 2015-07-08: qty 2
  Filled 2015-07-08: qty 3
  Filled 2015-07-08: qty 2
  Filled 2015-07-08: qty 31
  Filled 2015-07-08 (×5): qty 3
  Filled 2015-07-08: qty 15
  Filled 2015-07-08 (×3): qty 3

## 2015-07-08 MED ORDER — PAROXETINE HCL 20 MG PO TABS
30.0000 mg | ORAL_TABLET | ORAL | Status: DC
  Administered 2015-07-09 – 2015-07-12 (×4): 30 mg via ORAL
  Filled 2015-07-08 (×6): qty 1

## 2015-07-08 MED ORDER — OXYCODONE HCL 5 MG PO TABS: 5.0000 mg | ORAL_TABLET | Freq: Once | ORAL | Status: DC | PRN

## 2015-07-08 MED ORDER — ACETAMINOPHEN 650 MG RE SUPP
650.0000 mg | Freq: Four times a day (QID) | RECTAL | Status: DC | PRN
Start: 2015-07-08 — End: 2015-07-12

## 2015-07-08 MED ORDER — SODIUM CHLORIDE 0.9 % IV SOLN
INTRAVENOUS | Status: DC
  Administered 2015-07-08 – 2015-07-09 (×2): via INTRAVENOUS

## 2015-07-08 MED ORDER — PROPOFOL 500 MG/50ML IV EMUL
INTRAVENOUS | Status: DC | PRN
  Administered 2015-07-08: 50 ug/kg/min via INTRAVENOUS

## 2015-07-08 MED ORDER — MAGNESIUM CITRATE PO SOLN
1.0000 | Freq: Once | ORAL | Status: DC | PRN
  Filled 2015-07-08: qty 296

## 2015-07-08 MED ORDER — GABAPENTIN 600 MG PO TABS
600.0000 mg | ORAL_TABLET | ORAL | Status: DC
  Administered 2015-07-09 – 2015-07-12 (×4): 600 mg via ORAL
  Filled 2015-07-08 (×6): qty 1

## 2015-07-08 MED ORDER — CEFAZOLIN SODIUM-DEXTROSE 2-4 GM/100ML-% IV SOLN
2.0000 g | Freq: Four times a day (QID) | INTRAVENOUS | Status: AC
  Administered 2015-07-08 (×2): 2 g via INTRAVENOUS
  Filled 2015-07-08 (×3): qty 100

## 2015-07-08 MED ORDER — MIDAZOLAM HCL 5 MG/5ML IJ SOLN
INTRAMUSCULAR | Status: DC | PRN
  Administered 2015-07-08 (×2): 0.5 mg via INTRAVENOUS
  Administered 2015-07-08: 1 mg via INTRAVENOUS

## 2015-07-08 MED ORDER — DOXEPIN HCL 25 MG PO CAPS
50.0000 mg | ORAL_CAPSULE | Freq: Every day | ORAL | Status: DC
  Administered 2015-07-08 – 2015-07-11 (×4): 50 mg via ORAL
  Filled 2015-07-08 (×4): qty 2

## 2015-07-08 MED ORDER — BUPIVACAINE LIPOSOME 1.3 % IJ SUSP
INTRAMUSCULAR | Status: AC
  Filled 2015-07-08: qty 20

## 2015-07-08 MED ORDER — CELECOXIB 200 MG PO CAPS
200.0000 mg | ORAL_CAPSULE | Freq: Two times a day (BID) | ORAL | Status: DC
  Administered 2015-07-08 – 2015-07-11 (×6): 200 mg via ORAL
  Filled 2015-07-08 (×7): qty 1

## 2015-07-08 MED ORDER — NEOMYCIN-POLYMYXIN B GU 40-200000 IR SOLN
Status: AC
  Filled 2015-07-08: qty 20

## 2015-07-08 MED ORDER — POTASSIUM CHLORIDE CRYS ER 10 MEQ PO TBCR
10.0000 meq | EXTENDED_RELEASE_TABLET | Freq: Every day | ORAL | Status: DC
  Administered 2015-07-09 – 2015-07-12 (×4): 10 meq via ORAL
  Filled 2015-07-08 (×5): qty 1

## 2015-07-08 MED ORDER — PANTOPRAZOLE SODIUM 40 MG PO TBEC
40.0000 mg | DELAYED_RELEASE_TABLET | Freq: Every day | ORAL | Status: DC
  Administered 2015-07-09 – 2015-07-12 (×4): 40 mg via ORAL
  Filled 2015-07-08 (×5): qty 1

## 2015-07-08 MED ORDER — CALCIUM CARBONATE-VITAMIN D 500-200 MG-UNIT PO TABS
2.0000 | ORAL_TABLET | Freq: Every day | ORAL | Status: DC
  Administered 2015-07-09 – 2015-07-12 (×4): 2 via ORAL
  Filled 2015-07-08 (×5): qty 2

## 2015-07-08 MED ORDER — OXYCODONE HCL 5 MG/5ML PO SOLN: 5.0000 mg | Freq: Once | ORAL | Status: DC | PRN

## 2015-07-08 MED ORDER — ASPIRIN EC 81 MG PO TBEC
81.0000 mg | DELAYED_RELEASE_TABLET | Freq: Every day | ORAL | Status: DC
  Administered 2015-07-09 – 2015-07-11 (×3): 81 mg via ORAL
  Filled 2015-07-08 (×4): qty 1

## 2015-07-08 MED ORDER — FENTANYL CITRATE (PF) 100 MCG/2ML IJ SOLN
INTRAMUSCULAR | Status: DC | PRN
  Administered 2015-07-08: 25 ug via INTRAVENOUS
  Administered 2015-07-08: 50 ug via INTRAVENOUS
  Administered 2015-07-08: 25 ug via INTRAVENOUS

## 2015-07-08 SURGICAL SUPPLY — 62 items
AUTOTRANSFUS HAS 1/8 (MISCELLANEOUS) ×3
BAG DECANTER FOR FLEXI CONT (MISCELLANEOUS) ×3 IMPLANT
BLADE DEBAKEY 8.0 (BLADE) ×2 IMPLANT
BLADE DEBAKEY 8.0MM (BLADE) ×1
BLADE SAW 1 (BLADE) ×3 IMPLANT
BLADE SAW 1/2 (BLADE) ×3 IMPLANT
BLADE SURG 15 STRL LF DISP TIS (BLADE) ×1 IMPLANT
BLADE SURG 15 STRL SS (BLADE) ×2
CANISTER SUCT 1200ML W/VALVE (MISCELLANEOUS) ×6 IMPLANT
CAP KNEE TOTAL 3 SIGMA ×3 IMPLANT
CATH FOLEY SIL 2WAY 12FR5CC (CATHETERS) ×3 IMPLANT
CATH TRAY METER 16FR LF (MISCELLANEOUS) ×3 IMPLANT
CEMENT HV SMART SET (Cement) ×6 IMPLANT
CNTNR SPEC 2.5X3XGRAD LEK (MISCELLANEOUS) ×1
CONT SPEC 4OZ STER OR WHT (MISCELLANEOUS) ×2
CONTAINER SPEC 2.5X3XGRAD LEK (MISCELLANEOUS) ×1 IMPLANT
COOLER POLAR GLACIER W/PUMP (MISCELLANEOUS) ×3 IMPLANT
DRAPE IMP U-DRAPE 54X76 (DRAPES) ×3 IMPLANT
DRAPE INCISE IOBAN 66X60 STRL (DRAPES) ×3 IMPLANT
DRAPE SHEET LG 3/4 BI-LAMINATE (DRAPES) ×6 IMPLANT
DRAPE SURG 17X11 SM STRL (DRAPES) ×6 IMPLANT
DRSG OPSITE POSTOP 4X12 (GAUZE/BANDAGES/DRESSINGS) ×3 IMPLANT
DRSG OPSITE POSTOP 4X14 (GAUZE/BANDAGES/DRESSINGS) IMPLANT
DURAPREP 26ML APPLICATOR (WOUND CARE) ×9 IMPLANT
ELECT REM PT RETURN 9FT ADLT (ELECTROSURGICAL) ×3
ELECTRODE REM PT RTRN 9FT ADLT (ELECTROSURGICAL) ×1 IMPLANT
GAUZE SPONGE 4X4 12PLY STRL (GAUZE/BANDAGES/DRESSINGS) ×3 IMPLANT
GLOVE BIOGEL PI IND STRL 9 (GLOVE) ×3 IMPLANT
GLOVE BIOGEL PI INDICATOR 9 (GLOVE) ×6
GLOVE SURG 9.0 ORTHO LTXF (GLOVE) ×9 IMPLANT
GOWN STRL REUS TWL 2XL XL LVL4 (GOWN DISPOSABLE) ×3 IMPLANT
GOWN STRL REUS W/ TWL LRG LVL3 (GOWN DISPOSABLE) ×4 IMPLANT
GOWN STRL REUS W/TWL LRG LVL3 (GOWN DISPOSABLE) ×8
GOWN STRL REUS W/TWL XL LVL4 (GOWN DISPOSABLE) ×3 IMPLANT
HANDPIECE SUCTION TUBG SURGILV (MISCELLANEOUS) ×3 IMPLANT
IMMBOLIZER KNEE 19 BLUE UNIV (SOFTGOODS) ×3 IMPLANT
IV NS 100ML SINGLE PACK (IV SOLUTION) ×3 IMPLANT
KIT RM TURNOVER STRD PROC AR (KITS) ×3 IMPLANT
NDL SAFETY 18GX1.5 (NEEDLE) ×3 IMPLANT
NDL SAFETY 22GX1.5 (NEEDLE) IMPLANT
NEEDLE SPNL 20GX3.5 QUINCKE YW (NEEDLE) ×3 IMPLANT
NS IRRIG 1000ML POUR BTL (IV SOLUTION) ×3 IMPLANT
PACK TOTAL KNEE (MISCELLANEOUS) ×3 IMPLANT
PAD PREP 24X41 OB/GYN DISP (PERSONAL CARE ITEMS) IMPLANT
PAD WRAPON POLAR KNEE (MISCELLANEOUS) ×1 IMPLANT
SOL .9 NS 3000ML IRR  AL (IV SOLUTION) ×2
SOL .9 NS 3000ML IRR UROMATIC (IV SOLUTION) ×1 IMPLANT
SPONGE DRAIN TRACH 4X4 STRL 2S (GAUZE/BANDAGES/DRESSINGS) ×3 IMPLANT
SPONGE LAP 18X18 5 PK (GAUZE/BANDAGES/DRESSINGS) ×3 IMPLANT
STAPLER SKIN PROX 35W (STAPLE) ×3 IMPLANT
SUCTION FRAZIER HANDLE 10FR (MISCELLANEOUS) ×2
SUCTION TUBE FRAZIER 10FR DISP (MISCELLANEOUS) ×1 IMPLANT
SUT ETHIBOND NAB CT1 #1 30IN (SUTURE) ×6 IMPLANT
SUT MNCRL AB 3-0 PS2 27 (SUTURE) ×6 IMPLANT
SUT VIC AB 0 CT1 36 (SUTURE) ×3 IMPLANT
SUT VIC AB 2-0 CT1 (SUTURE) ×6 IMPLANT
SYR 20CC LL (SYRINGE) ×3 IMPLANT
SYR 50ML LL SCALE MARK (SYRINGE) ×6 IMPLANT
SYSTEM AUTOTRANSFUS DUAL TROCR (MISCELLANEOUS) ×1 IMPLANT
TOWER CARTRIDGE SMART MIX (DISPOSABLE) ×3 IMPLANT
TUBE SUCT KAM VAC (TUBING) ×3 IMPLANT
WRAPON POLAR PAD KNEE (MISCELLANEOUS) ×3

## 2015-07-08 NOTE — Transfer of Care (Signed)
Immediate Anesthesia Transfer of Care Note  Patient: Sara Greene  Procedure(s) Performed: Procedure(s): TOTAL KNEE ARTHROPLASTY (Right)  Patient Location: PACU  Anesthesia Type:Spinal  Level of Consciousness: awake, alert  and oriented  Airway & Oxygen Therapy: Patient Spontanous Breathing and Patient connected to face mask oxygen  Post-op Assessment: Report given to RN and Post -op Vital signs reviewed and stable  Post vital signs: Reviewed and stable  Last Vitals:  Filed Vitals:   07/08/15 0619  BP: 156/79  Pulse: 94  Temp: 37.2 C  Resp: 16    Complications: No apparent anesthesia complications

## 2015-07-08 NOTE — H&P (Signed)
The patient has been re-examined, and the chart reviewed, and there have been no interval changes to the documented history and physical.    The risks, benefits, and alternatives have been discussed at length, and the patient is willing to proceed.   

## 2015-07-08 NOTE — Anesthesia Preprocedure Evaluation (Signed)
Anesthesia Evaluation  Patient identified by MRN, date of birth, ID band Patient awake    Reviewed: Allergy & Precautions, H&P , NPO status , Patient's Chart, lab work & pertinent test results  Airway Mallampati: III  TM Distance: >3 FB Neck ROM: full    Dental  (+) Poor Dentition, Implants, Missing, Edentulous Upper, Edentulous Lower   Pulmonary neg pulmonary ROS, neg shortness of breath,    Pulmonary exam normal breath sounds clear to auscultation       Cardiovascular Exercise Tolerance: Good hypertension, (-) angina(-) Past MI and (-) DOE Normal cardiovascular exam+ dysrhythmias Atrial Fibrillation  Rhythm:regular Rate:Normal     Neuro/Psych PSYCHIATRIC DISORDERS Depression negative neurological ROS     GI/Hepatic Neg liver ROS, GERD  Controlled,  Endo/Other  Hypothyroidism   Renal/GU negative Renal ROS  negative genitourinary   Musculoskeletal  (+) Arthritis ,   Abdominal   Peds  Hematology negative hematology ROS (+)   Anesthesia Other Findings Past Medical History:   Hypertension                                                 Hypothyroidism                                               Osteoporosis                                                 Depression                                                   Hyperlipidemia                                               GERD (gastroesophageal reflux disease)                       Chronic urethral narrowing                                     Comment:please use the smallest foley catheter               available for her   Atrial fibrillation (HCC)                                    Hyponatremia                                                 Hypokalemia  Arthritis                                                    Dysrhythmia                                                    Comment:a fib. per patient, dr.  Ronald Pippins could not               confirm this diagnosis  Past Surgical History:   CHOLECYSTECTOMY                                               COLONOSCOPY                                                   FOOT SURGERY                                    Right 2002           Comment:screws in right foot   APPENDECTOMY                                                  COLONOSCOPY WITH PROPOFOL                       N/A 10/15/2014      Comment:Procedure: COLONOSCOPY WITH PROPOFOL;  Surgeon:              Hulen Luster, MD;  Location: [facility] ENDOSCOPY;                Service: Gastroenterology;  Laterality: N/A;   TENDON REPAIR                                   Bilateral              fracture Rt arm                                 Right 2002           Comment:no metal   ROTATOR CUFF REPAIR                             Left 1990         BACK SURGERY  1977           Comment:no metal in back   DENTAL SURGERY                                   2016           Comment:dental implants to hook dentures on  BMI    Body Mass Index   31.95 kg/m 2      Reproductive/Obstetrics negative OB ROS                             Anesthesia Physical Anesthesia Plan  ASA: III  Anesthesia Plan: Spinal   Post-op Pain Management:    Induction:   Airway Management Planned:   Additional Equipment:   Intra-op Plan:   Post-operative Plan:   Informed Consent: I have reviewed the patients History and Physical, chart, labs and discussed the procedure including the risks, benefits and alternatives for the proposed anesthesia with the patient or authorized representative who has indicated his/her understanding and acceptance.   Dental Advisory Given  Plan Discussed with: Anesthesiologist, CRNA and Surgeon  Anesthesia Plan Comments:         Anesthesia Quick Evaluation

## 2015-07-08 NOTE — Evaluation (Signed)
PT Cancellation Note  Patient Details Name: Sara Greene MRN: [id number] DOB: 1940/08/28   Cancelled Treatment:    Reason Eval/Treat Not Completed: Other (comment) Pt expressed she still does not have sensation in B LE upon start of evaluation. Tested sensation and confirmed. Unsafe to ambulate at this time. Will hold PT until tomorrow when sensation is normal.    Sherral Hammers 07/08/2015, 3:32 PM M. Barnett Abu, SPT

## 2015-07-08 NOTE — Progress Notes (Signed)
Pt. Went from a 10 on the pain scale to a 7. Looks to be resting comfortably at this time. CPM applied. Pt. Tolerating it well at this time.

## 2015-07-08 NOTE — Op Note (Signed)
DATE OF SURGERY:  4/20/201 TIME: 10:41 AM  PATIENT NAME:  Sara Greene   AGE: 75 y.o.    PRE-OPERATIVE DIAGNOSIS:  Right knee osteoarthritis  POST-OPERATIVE DIAGNOSIS:  Same  PROCEDURE:  Procedure(s): TOTAL KNEE ARTHROPLASTY  SURGEON:  Thornton Park, MD   ASSISTANT:  OR Tech  OPERATIVE IMPLANTS: Duy PFC Sigma, Posterior Stabilized Femural component size 4, Tibia size rotating platform component size 4, Patella polyethylene 3-peg oval button size 41, with a 12.5 mm polyethylene insert.  PREOPERATIVE INDICATIONS:  Sara Greene is an 75 y.o. female who has a diagnosis of right knee osteoarthritis with an osteochondral lesion of the weightbearing surface of the medial femoral condyle diagnosed by MRI.  She has significant impairment of her activities of daily living and ability to ambulate.  She has elected to proceed with a right total knee arthroplasty.  Radiographs have demonstrated tricompartmental osteoarthritis joint space narrowing, osteophytes, subchondral sclerosis and cyst formation.  MRI showed tricompartmental osteoarthritis, most advanced in the patellofemoral and medial compartments with an osteochondral lesion in the medial femoral condyle with subchondral edema and a torn and extruded medial meniscus.  The risks, benefits, and alternatives were discussed at length including but not limited to the risks of infection, bleeding, nerve or blood vessel injury, knee stiffness, fracture, dislocation, loosening or failure of the hardware and the need for further surgery. Medical risks include but not limited to DVT and pulmonary embolism, myocardial infarction, stroke, pneumonia, respiratory failure and death. I discussed these risks with the patient in my office prior to the date of surgery. They understood these risks and were willing to proceed.   OPERATIVE DESCRIPTION:  The patient was brought to the operative room and placed in a supine position after undergoing of spinal  anesthesia.  A Foley catheter was placed.  IV antibiotics were given. Patient received 2 grams of ancef. The lower extremity was prepped and draped in the usual sterile fashion.  A time out was performed to verify the patient's name, date of birth, medical record number, correct site of surgery and correct procedure to be performed. The timeout was also used to confirm the patient received antibiotics and that appropriate instruments, implants and radiographs studies were available in the room.  The leg was elevated and exsanguinated with an Esmarch and the tourniquet was inflated to 275 mmHg for 112 minutes..  A midline incision was made over the right knee. Full-thickness skin flaps were developed. A medial parapatellar arthrotomy was then made and the patella everted and the knee was brought into 90 of flexion. Hoffa's fat pad along with the cruciate ligaments and medial and lateral menisci were resected.   The distal femoral intramedullary canal was opened with a drill and the intramedullary distal femoral cutting jig was inserted into the femoral canal pinned into position. It was set at 5 degrees resecting 10 mm off the distal femur.  Care was taken to protect the collateral ligaments during distal femoral resection.  The distal femoral resection was performed with an oscillating saw. The femoral cutting guide was then removed.  The extramedullary tibial cutting guide was then placed using the anterior tibial crest and second ray of the foot as a references.  The tibial cutting guide was adjusted to allow for appropriate posterior slope.  The tibial cutting block was pinned into position. The slotted stylus was used to measure the proximal tibial resection of 10 mm off the high lateral side.  The tibial long rod alignment guide was then used  to confirm position of the cutting block. A third cross pin through the tibial cutting block was then drilled into position to allow for rotational stability. Care  was taken during the tibial resection to protect the medial and collateral ligaments.  The resected tibial bone was removed along with the posterior horns of the menisci.  The PCL was sacrificed.  Extension gap was measured with a spacer block and alignment and extension was confirmed using a long alignment rod.  The attention was then turned back to the femur. The posterior referencing distal femoral sizing guide was applied to the distal femur.  The femur was sized to be a 4. Rotation of the referencing guide was checked with the epicondylar axis and Whitesides line. Then the 4-in-1 cutting jig was then applied to the distal femur. A stylus was used to confirm that the anterior femur would not be notched.   Then the anterior, posterior and chamfer femoral cuts were then made with an oscillating saw.  All posterior osteophytes were removed.  The flexion gap was then measured with a flexion spacer block and long alignment rod and was found to be symmetric with the extension gap and perpendicular to mechanical axis of the tibia.  The distal femoral preparation was completed by performing the posterior stabilized box cut using the cutting block. The entry site for the intramedullary femoral guide was filled with autologous bone graft from bone previously resected earlier in the case.  The proximal tibia plateau was then sized with trial trays. The best coverage was achieved with a size 4. This tibial tray was then pinned into position. The proximal tibia was then prepared with the reamer and keel punch.  After tibial preparation was completed, all trial components were inserted with polyethylene trials.  The knee was found to have excellent balance and full motion with a size 10 mm tibial polyethylene insert..    The attention was then turned to preparation of the patella. The thickness of the patella was measured with a caliper, the diameter measured with the patella templates.  The patient had a size 41  patella.  The patella resection was then made with an oscillating saw using the patella cutting guide.  The final patellar thickness 14 mm.  3 peg holes for the patella component were then drilled. The trial patella was then placed. Knee was taken through a full range of motion and deemed to be stable with the trial components. All trial components were then removed. The knee capsule was then injected with Exparel. The joint was copiously irrigated with pulse lavage.  The final total knee arthroplasty components were then cemented into place with a 10 mm trial polyethylene insert and all excess methylmethacrylate was removed.  The joint was again copiously irrigated. After the cement had hardened the knee was again taken through a full range of motion. It was felt to be most stable with the 12.5 mm tibial polyethylene insert. The actual tibial polyethylene insert was then placed.   The knee was taken through a range of motion and the patella tracked well and the knee was again irrigated copiously.    An Autovac drain was placed. The 2 drain limbs were brought out through the superior lateral knee. The medial arthrotomy was closed with #1 Ethibond. The subcutaneous tissue closed with 0 and 2-0 vicryl, and skin approximated with staples.  A dry sterile and compressive dressing was applied.  A Polar Care was applied to the operative knee along with TENS  unit pads and a knee immobilizer.  The patient was awakened and brought to the PACU in stable and satisfactory condition.  All sharp, lap and instrument counts were correct at the conclusion the case. I spoke with the patient's family in the postop consultation room to let them know the case it done without complication and the patient was stable in recovery room.   Total tourniquet time was 112 minutes.    Timoteo Gaul, MD

## 2015-07-08 NOTE — Anesthesia Procedure Notes (Signed)
Spinal Patient location during procedure: OR Start time: 07/08/2015 7:44 AM End time: 07/08/2015 7:54 AM Staffing Anesthesiologist: Andria Frames Resident/CRNA: Demetrius Charity Performed by: resident/CRNA  Preanesthetic Checklist Completed: patient identified, site marked, surgical consent, pre-op evaluation, timeout performed, IV checked, risks and benefits discussed and monitors and equipment checked Spinal Block Patient position: sitting Prep: ChloraPrep Patient monitoring: heart rate, continuous pulse ox, blood pressure and cardiac monitor Approach: midline Location: L3-4 Injection technique: single-shot Needle Needle type: Whitacre and Introducer  Needle gauge: 25 G Needle length: 9 cm Assessment Sensory level: T10 Additional Notes Negative paresthesia. Negative blood return. Positive free-flowing CSF. Expiration date of kit checked and confirmed. Patient tolerated procedure well, without complications.

## 2015-07-09 ENCOUNTER — Encounter
Admission: RE | Admit: 2015-07-09 | Discharge: 2015-07-09 | Disposition: A | Discharge status: Home / Self Care | Payer: Medicare Other | Source: Ambulatory Visit | Attending: Internal Medicine | Admitting: Internal Medicine

## 2015-07-09 ENCOUNTER — Encounter: Payer: Self-pay | Admitting: Orthopedic Surgery

## 2015-07-09 DIAGNOSIS — D649 Anemia, unspecified: Secondary | ICD-10-CM | POA: Insufficient documentation

## 2015-07-09 DIAGNOSIS — E871 Hypo-osmolality and hyponatremia: Secondary | ICD-10-CM | POA: Insufficient documentation

## 2015-07-09 LAB — BASIC METABOLIC PANEL
ANION GAP: 7 (ref 5–15)
BUN: 9 mg/dL (ref 6–20)
CALCIUM: 7.8 mg/dL — AB (ref 8.9–10.3)
CO2: 24 mmol/L (ref 22–32)
Chloride: 99 mmol/L — ABNORMAL LOW (ref 101–111)
Creatinine, Ser: 1 mg/dL (ref 0.44–1.00)
GFR, EST NON AFRICAN AMERICAN: 54 mL/min — AB (ref >60)
GLUCOSE: 135 mg/dL — AB (ref 65–99)
POTASSIUM: 4.6 mmol/L (ref 3.5–5.1)
Sodium: 130 mmol/L — ABNORMAL LOW (ref 135–145)

## 2015-07-09 LAB — CBC
HCT: 29.2 % — ABNORMAL LOW (ref 35.0–47.0)
Hemoglobin: 9.9 g/dL — ABNORMAL LOW (ref 12.0–16.0)
MCH: 29.8 pg (ref 26.0–34.0)
MCHC: 34 g/dL (ref 32.0–36.0)
MCV: 87.6 fL (ref 80.0–100.0)
PLATELETS: 152 10*3/uL (ref 150–440)
RBC: 3.34 MIL/uL — AB (ref 3.80–5.20)
RDW: 13.5 % (ref 11.5–14.5)
WBC: 9.4 10*3/uL (ref 3.6–11.0)

## 2015-07-09 MED ORDER — OXYCODONE HCL 10 MG PO TABS: 10.0000 mg | ORAL_TABLET | ORAL | Status: DC | PRN

## 2015-07-09 MED ORDER — ENOXAPARIN SODIUM 30 MG/0.3ML ~~LOC~~ SOLN: 40.0000 mg | Freq: Two times a day (BID) | SUBCUTANEOUS | Status: DC

## 2015-07-09 MED ORDER — OXYCODONE HCL 10 MG PO TABS: 5.0000 mg | ORAL_TABLET | ORAL | Status: DC | PRN

## 2015-07-09 MED ORDER — MORPHINE SULFATE ER 30 MG PO TBCR: 30.0000 mg | EXTENDED_RELEASE_TABLET | Freq: Two times a day (BID) | ORAL | Status: DC

## 2015-07-09 NOTE — Progress Notes (Signed)
Foley d/c'd with 300cc urine output at 0555

## 2015-07-09 NOTE — Evaluation (Signed)
Occupational Therapy Evaluation Patient Details Name: Sara Greene MRN: [id number] DOB: 1940/06/06 Today's Date: 07/09/2015    History of Present Illness Pt is a 75 y.o. Female who received R TKA on 4/20. Pt has hx of HTN, osteoporosis, depression, and A-fib. Pt very active.   Clinical Impression   Pt is 74 year old Female s/p right TKR.  Pt was independent in all ADLs/IADLs prior to surgery and is eager to return to her PLOF.  Pt currently requires moderate assist for LB dressing, and minA for toileting care needs secondary to pain and limited AROM of right knee.  Pt would benefit from continued instruction in improving LE ADLs with or without assistive devices due to knee immobilizer, as well as improving mobilty for safe toileting care.  Pt would also benefit from recommendations for home modifications.    Follow Up Recommendations  Home health OT    Equipment Recommendations       Recommendations for Other Services PT consult     Precautions / Restrictions Precautions Precautions: Fall Required Braces or Orthoses: Knee Immobilizer - Right Restrictions Weight Bearing Restrictions: Yes RLE Weight Bearing: Partial weight bearing Other Position/Activity Restrictions: PWB      Mobility Bed Mobility Overal bed mobility: Needs Assistance Bed Mobility: Sit to Supine       Sit to supine: Mod assist   General bed mobility comments: Pt able to perform bed mobility with use of bed rails and mod assist. Pt required assist for R LE and to lower trunk onto bed.   Transfers Overall transfer level: Needs assistance Equipment used: Rolling walker (2 wheeled) Transfers: Sit to/from Stand Sit to Stand: Min assist         General transfer comment: Pt. performed transfers to [place] with MinA.    Balance Overall balance assessment: Needs assistance   Sitting balance-Leahy Scale: Good       Standing balance-Leahy Scale: Fair                              ADL  Overall ADL's : Needs assistance/impaired Eating/Feeding: Independent   Grooming: Minimal assistance               Lower Body Dressing: Moderate assistance   Toilet Transfer: Minimal assistance   Toileting- Clothing Manipulation and Hygiene: Minimal assistance               Vision     Perception     Praxis      Pertinent Vitals/Pain Pain Assessment: 0-10 Pain Score: 7  Pain Location: Right Knee Pain Descriptors / Indicators: Operative site guarding Pain Intervention(s): Limited activity within patient's tolerance     Hand Dominance     Extremity/Trunk Assessment Upper Extremity Assessment Upper Extremity Assessment: Overall WFL for tasks assessed           Communication     Cognition Arousal/Alertness: Awake/alert Behavior During Therapy: WFL for tasks assessed/performed Overall Cognitive Status: Within Functional Limits for tasks assessed                     General Comments       Exercises Exercises: Other exercises;Total Joint Other Exercises Other Exercises: Pt performed supine ther-ex on R LE including ankle pumps and quad sets with no assist, hip abd and SAQ with min assist, and SLR with mod assist. All ther-ex performed x10 reps.    Shoulder Instructions  Home Living Family/patient expects to be discharged to:: Private residence Living Arrangements: Spouse/significant other Available Help at Discharge: Family Type of Home: House Home Access: Level entry     [place]: One level     Bathroom Shower/Tub: Tub/shower unit (Handheld shower head) Shower/tub characteristics: Architectural technologist: Standard     Home Equipment: Toilet riser   Additional Comments: Pt lives at home with husband who is around during the day to assist. Daughter is a Patent examiner.      Prior Functioning/Environment Level of Independence: Independent        Comments: Pt able to perform all ADLs independently.     OT  Diagnosis: Generalized weakness   OT Problem List: Decreased strength;Decreased activity tolerance;Decreased knowledge of use of DME or AE;Pain   OT Treatment/Interventions: Self-care/ADL training;Therapeutic exercise;DME and/or AE instruction;Therapeutic activities;Patient/family education;Balance training    OT Goals(Current goals can be found in the care plan section) Acute Rehab OT Goals Patient Stated Goal: To be independent with ADLs. OT Goal Formulation: With patient/family Time For Goal Achievement: 07/23/15 Potential to Achieve Goals: Good  OT Frequency: Min 2X/week   Barriers to D/C:            Co-evaluation              End of Session Equipment Utilized During Treatment: Gait belt;Rolling walker;Right knee immobilizer  Activity Tolerance: Patient tolerated treatment well Patient left: in chair;with call bell/phone within reach;with family/visitor present   Time: 1140-1210 OT Time Calculation (min): 30 min Charges:  OT General Charges $OT Visit: 1 Procedure OT Evaluation $OT Eval Moderate Complexity: 1 Procedure OT Treatments $Self Care/Home Management : 8-22 mins G-Codes:    Harrel Carina, MS, OTR/L Harrel Carina 07/09/2015, 3:50 PM

## 2015-07-09 NOTE — Discharge Instructions (Addendum)
Continue 50% partial weightbearing on the right lower extremity 1 month postop. Continue to use TED stockings until follow-up. Patient may remove them at night for sleep. Elevate the right lower extremity whenever possible. Continue to use knee immobilizer at night or when lying in bed or when elevating the operative leg. The patient may remove the knee immobilizer to perform exercises or sit in a chair. Continue using the TENS unit and Polar Care for comfort. Keep incision clean and dry. Cover the right knee incision during showers with a plastic bag or Saran wrap. Take ECASA 325mg  PO twice a day for blood clot prevention. Continue to work on knee range of motion exercises at rehab as instructed by physical therapy. Continue to use a walker for assistance with ambulation until follow-up.

## 2015-07-09 NOTE — Anesthesia Postprocedure Evaluation (Signed)
Anesthesia Post Note  Patient: Sales promotion account executive  Procedure(s) Performed: Procedure(s) (LRB): TOTAL KNEE ARTHROPLASTY (Right)  Patient location during evaluation: Nursing Unit Anesthesia Type: Spinal Level of consciousness: awake, awake and alert and oriented Pain management: pain level controlled Vital Signs Assessment: post-procedure vital signs reviewed and stable Respiratory status: spontaneous breathing, nonlabored ventilation and respiratory function stable Cardiovascular status: blood pressure returned to baseline and stable Postop Assessment: no headache, no backache, patient able to bend at knees and no signs of nausea or vomiting Anesthetic complications: no    Last Vitals:  Filed Vitals:   07/08/15 2100 07/09/15 0359  BP: 131/63 133/63  Pulse: 88 88  Temp: 37 C 37.2 C  Resp:  16    Last Pain:  Filed Vitals:   07/09/15 0518  PainSc: Asleep                 Johnna Acosta

## 2015-07-09 NOTE — Progress Notes (Signed)
Physical Therapy Treatment Patient Details Name: Sara Greene MRN: [id number] DOB: 1940/08/27 Today's Date: 07/09/2015    History of Present Illness Pt is a 75 y.o. F who received R TKA on 4/20. Pt has hx of HTN, osteoporosis, depression, and A-fib. Pt very active.    PT Comments    Pt is progressing towards goals. Pt expressed being in a lot of pain at start of treatment, was waiting on pain medicine. Pt able to perform bed mobility with bed rails and mod assist. Pt able to transfer from chair with RW and min assist. Pt ambulated in room approx 10 ft using RW and mod assist. Ambulation limited by pts pain. Pt performed supine there-ex on R LE with mod to no assist. Pt demonstrates deficits in strength, ROM, and mobility. Pt would benefit from further skilled PT to address deficits; recommend pt sent to SNF after discharge from acute hospitalization.   Follow Up Recommendations  SNF     Equipment Recommendations       Recommendations for Other Services       Precautions / Restrictions Precautions Precautions: Fall Restrictions Weight Bearing Restrictions: Yes Other Position/Activity Restrictions: PWB    Mobility  Bed Mobility Overal bed mobility: Needs Assistance Bed Mobility: Sit to Supine     Supine to sit: Mod assist Sit to supine: Mod assist   General bed mobility comments: Pt able to perform bed mobility with use of bed rails and mod assist. Pt required assist for R LE and to lower trunk onto bed.   Transfers Overall transfer level: Needs assistance Equipment used: Rolling walker (2 wheeled) Transfers: Sit to/from Stand Sit to Stand: Min assist         General transfer comment: Pt able to transfer from recliner using RW and min assist. Pt impulsive and began to stand prior to instruction. Pt provided cues regarding hand placement on RW.  Ambulation/Gait Ambulation/Gait assistance: Mod assist Ambulation Distance (Feet): 10 Feet Assistive device: Rolling walker  (2 wheeled) Gait Pattern/deviations: Step-to pattern Gait velocity: slow   General Gait Details: Pt able to ambulate approx 10 ft in room using RW and mod assist. Pt demonstrated step-to, slow gait pattern. Pt provided cues regarding walker and hand placement. Ambulation limited by pts pain.    Stairs            Wheelchair Mobility    Modified Rankin (Stroke Patients Only)       Balance Overall balance assessment: Needs assistance Sitting-balance support: Bilateral upper extremity supported;Feet supported Sitting balance-Leahy Scale: Good Sitting balance - Comments: Pt demonstrates good sitting balance with use of B UE and feet supported. Able to maintain for approx 3 mins. at EOB   Standing balance support: Bilateral upper extremity supported Standing balance-Leahy Scale: Fair Standing balance comment: Pt able to maintain standing balance with RW and CGA. Pt required increased time to find balance when standing to maintain WB precautions.                     Cognition Arousal/Alertness: Awake/alert Behavior During Therapy: WFL for tasks assessed/performed Overall Cognitive Status: Within Functional Limits for tasks assessed                      Exercises Total Joint Exercises Goniometric ROM: R LE AAROM: 10 - 55 degrees (will reassess flexion this afternoon) Other Exercises Other Exercises: Pt performed supine ther-ex on R LE including ankle pumps and quad sets with no assist, hip  abd and SAQ with min assist, and SLR with mod assist. All ther-ex performed x10 reps.     General Comments        Pertinent Vitals/Pain Pain Assessment: 0-10 Pain Score: 10-Worst pain ever Pain Location: R Knee Pain Descriptors / Indicators: Operative site guarding Pain Intervention(s): Limited activity within patient's tolerance;RN gave pain meds during session    [place] expects to be discharged to:: Private residence Living Arrangements:  Spouse/significant other Available Help at Discharge: Family Type of Home: House Home Access: Level entry   Home Layout: One level Home Equipment: Environmental consultant - 4 wheels;Walker - 2 wheels;Cane - single point;Bedside commode Additional Comments: Pt lives at home with husband who is around during the day to assist.     Prior Function Level of Independence: Independent with assistive device(s)      Comments: Pt able to perform all ADLs independently. Pt ambulated with RW in house and SPC in the community.    PT Goals (current goals can now be found in the care plan section) Acute Rehab PT Goals Patient Stated Goal: to be independent with all ADLs PT Goal Formulation: With patient Time For Goal Achievement: 07/23/15 Potential to Achieve Goals: Good Progress towards PT goals: Progressing toward goals    Frequency  BID    PT Plan Current plan remains appropriate    Co-evaluation             End of Session Equipment Utilized During Treatment: Gait belt;Right knee immobilizer Activity Tolerance: Patient tolerated treatment well Patient left: in bed;with bed alarm set;with family/visitor present;with call bell/phone within reach     Time: 1348-1414 PT Time Calculation (min) (ACUTE ONLY): 26 min  Charges:  $Therapeutic Exercise: 8-22 mins                    G Codes:      Sherral Hammers Jul 16, 2015, 2:48 PM M. Barnett Abu, SPT

## 2015-07-09 NOTE — Care Management Note (Addendum)
Case Management Note  Patient Details  Name: Sara Greene MRN: [id number] Date of Birth: [birth year]  Subjective/Objective:                   Met with patient and her daughter [name] (which will be staying with patient until Monday) to discuss discharge planning. Patient agrees to SNF but would like to return home with her husband. She states she is borrowing a walker from a friend and will not need one. She uses [facility] for RX [phone]. She is on Lovenox which I will verify discharge dose need with MD.  PT pending. Action/Plan: List of home health agencies left with patient. RNCM will continue to follow.   Expected Discharge Date:                  Expected Discharge Plan:     In-House Referral:     Discharge planning Services  CM Consult  Post Acute Care Choice:  Home Health Choice offered to:  Patient, Adult Children  DME Arranged:    DME Agency:     HH Arranged:    [place] Agency:     Status of Service:  In process, will continue to follow  Medicare Important Message Given:    Date Medicare IM Given:    Medicare IM give by:    Date Additional Medicare IM Given:    Additional Medicare Important Message give by:     If discussed at [place] of Stay Meetings, dates discussed:    Additional Comments:  Marshell Garfinkel, RN 07/09/2015, 10:12 AM

## 2015-07-09 NOTE — Clinical Social Work Placement (Signed)
   CLINICAL SOCIAL WORK PLACEMENT  NOTE  Date:  07/09/2015  Patient Details  Name: Sara Greene MRN: [id number] Date of Birth: 10-02-1940  Clinical Social Work is seeking post-discharge placement for this patient at the [place] level of care (*CSW will initial, date and re-position this form in  chart as items are completed):  Yes   Patient/family provided with [place] Work Department's list of facilities offering this level of care within the geographic area requested by the patient (or if unable, by the patient's family).  Yes   Patient/family informed of their freedom to choose among providers that offer the needed level of care, that participate in Medicare, Medicaid or managed care program needed by the patient, have an available bed and are willing to accept the patient.  Yes   Patient/family informed of 's ownership interest in [facility] and [facility], as well as of the fact that they are under no obligation to receive care at these facilities.  PASRR submitted to EDS on 07/09/15     PASRR number received on 07/09/15     Existing PASRR number confirmed on       FL2 transmitted to all facilities in geographic area requested by pt/family on 07/09/15     FL2 transmitted to all facilities within larger geographic area on       Patient informed that his/her managed care company has contracts with or will negotiate with certain facilities, including the following:        Yes   Patient/family informed of bed offers received.  Patient chooses bed at  [facility] )     Physician recommends and patient chooses bed at      Patient to be transferred to   on  .  Patient to be transferred to facility by       Patient family notified on   of transfer.  Name of family member notified:        PHYSICIAN       Additional Comment:    _______________________________________________ Loralyn Freshwater, LCSW 07/09/2015,  4:48 PM

## 2015-07-09 NOTE — Clinical Social Work Note (Addendum)
Clinical Social Work Assessment  Patient Details  Name: Sara Greene MRN: [id number] Date of Birth: [birth year]  Date of referral:  07/09/15               Reason for consult:  Facility Placement                Permission sought to share information with:  Chartered certified accountant granted to share information::  Yes, Verbal Permission Granted  Name::      Sara Greene::   Sara Greene   Relationship::     Contact Information:     Housing/Transportation Living arrangements for the past 2 months:  [place] of Information:  Patient Patient Interpreter Needed:  None Criminal Activity/Legal Involvement Pertinent to Current Situation/Hospitalization:  No - Comment as needed Significant Relationships:  Adult Children, Spouse Lives with:  Spouse Do you feel safe going back to the place where you live?  Yes Need for family participation in patient care:  Yes (Comment)  Care giving concerns:  Patient lives in [place] with her husband Sara Greene.    Social Worker assessment / plan:  Holiday representative ([place]) received SNF consult. PT is recommending SNF. CSW met with patient alone at bedside. CSW introduced self and explained role of CSW department. Patient was alert and oriented and was laying in the bed. Patient reported that she lives with her husband in [place] and her daughter Sara Greene checks on her after she gets off work. CSW explained that PT is recommending SNF. Patient is agreeable to SNF search in [facility].   CSW presented bed offers to patient. She chose [facility] Sara Greene admissions coordinator at [facility] is aware of accepted bed offer. CSW started [facility] authorization today. [employer] Medicare authorization was received today. Authorization is good through Sunday 07/11/15. Auth # [id number] RUB. Plan is for patient to D/C to [facility] Sunday 07/11/15. Per Sara Greene patient will go to room 201-B. RN will call report at [phone]. CSW sent D/C Summary on Friday to [place].   Employment status:  Retired Nurse, adult PT Recommendations:  [facility] / Referral to community resources:  [place]  Patient/Family's Response to care:  Patient is agreeable to going to [facility]   Patient/Family's Understanding of and Emotional Response to Diagnosis, Current Treatment, and Prognosis:  Patient was pleasant and thanked CSW for visit.   Emotional Assessment Appearance:  Appears stated age Attitude/Demeanor/Rapport:    Affect (typically observed):  Accepting, Adaptable, Pleasant Orientation:  Oriented to Self, Oriented to Place, Oriented to  Time, Oriented to Situation Alcohol / Substance use:  Not Applicable Psych involvement (Current and /or in the community):  No (Comment)  Discharge Needs  Concerns to be addressed:  Discharge Planning Concerns Readmission within the last 30 days:  No Current discharge risk:  Dependent with Mobility Barriers to Discharge:  Continued Medical Work up   Sara Greene 07/09/2015, 4:49 PM

## 2015-07-09 NOTE — Progress Notes (Signed)
Per Dr. Charlane Ferretti to d/c cpm

## 2015-07-09 NOTE — Discharge Summary (Addendum)
Physician Discharge Summary  Patient ID: Sara Greene MRN: [id number] DOB/AGE: 1941-03-18 75 y.o.  Admit date: 07/08/2015 Discharge date: 07/12/2015  Admission Diagnoses:  Right knee osteoarthritis   Discharge Diagnoses:  Right knee osteoarthritis Active Problems:   History of right total knee arthroplasty   Past Medical History  Diagnosis Date  . Hypertension   . Hypothyroidism   . Osteoporosis   . Depression   . Hyperlipidemia   . GERD (gastroesophageal reflux disease)   . Chronic urethral narrowing     please use the smallest foley catheter available for her  . Atrial fibrillation ([place])   . Hyponatremia   . Hypokalemia   . Arthritis   . Dysrhythmia     a fib. per patient, dr. lithicum could not confirm this diagnosis  . Chronic pain     Surgeries: Procedure(s): TOTAL KNEE ARTHROPLASTY on 07/08/2015   Consultants (if any):    Discharged Condition: Improved  Hospital Course: Sara Greene is an 75 y.o. female who was admitted 07/08/2015 with a diagnosis of right knee osteoarthritis and went to the operating room on 07/08/2015 and underwent an uncomplicated right total knee arthroplasty.    She was given perioperative antibiotics:  Anti-infectives    Start     Dose/Rate Route Frequency Ordered Stop   07/08/15 1400  ceFAZolin (ANCEF) IVPB 2g/100 mL premix     2 g 200 mL/hr over 30 Minutes Intravenous Every 6 hours 07/08/15 1203 07/08/15 2144   07/08/15 0602  ceFAZolin (ANCEF) 2-4 GM/100ML-% IVPB    Comments:  Slemenda, Debbie: cabinet override      07/08/15 0602 07/08/15 1814   07/08/15 0515  ceFAZolin (ANCEF) IVPB 2g/100 mL premix     2 g 200 mL/hr over 30 Minutes Intravenous On call to O.R. 07/08/15 [id number] 07/08/15 0809    . She was been to the orthopedic floor postoperatively. Her Foley catheter was removed on postop day #1 as was her Hemovac drain. She received 24 hours of postop antibiotics. Patient began physical occupational therapy on postop day #1 and this  continued throughout her hospitalization. Her dressing was changed on postop day #3 by the orthopedic surgeon. Patient's hemoglobin was found to be 7.5 postop day #3 and she was given a single unit of packed red blood cells. Patient had mild tears as drainage from her incision on postoperative day #3 and the decision was made to stop Lovenox and begin the patient on enteric-coated aspirin 325 mg by mouth twice a day. Patient received 1 unit of packed red blood cells for her hemoglobin of 7.5. Patient responded well to this a unit. She is doing well clinically and is prepared for discharge to rehabilitation today.  She was given sequential compression devices, early ambulation, and lovenox, then ECASA  for DVT prophylaxis.    She benefited maximally from the hospital stay and there were no complications.    Recent vital signs:  Filed Vitals:   07/09/15 0359 07/09/15 0750  BP: 133/63 158/57  Pulse: 88 78  Temp: 98.9 F (37.2 C) 98.1 F (36.7 C)  Resp: 16 18    Recent laboratory studies:  Lab Results  Component Value Date   HGB 9.9* 07/09/2015   HGB 11.1* 07/08/2015   HGB 11.7* 06/23/2015   Lab Results  Component Value Date   WBC 9.4 07/09/2015   PLT 152 07/09/2015   Lab Results  Component Value Date   INR 0.97 06/23/2015   Lab Results  Component Value Date  NA 130* 07/09/2015   K 4.6 07/09/2015   CL 99* 07/09/2015   CO2 24 07/09/2015   BUN 9 07/09/2015   CREATININE 1.00 07/09/2015   GLUCOSE 135* 07/09/2015    Discharge Medications:     Medication List    STOP taking these medications        oxyCODONE-acetaminophen 10-325 MG tablet  Commonly known as:  PERCOCET     traMADol 50 MG tablet  Commonly known as:  ULTRAM      TAKE these medications        ECASA 325mg  PO BID x 6 weeks.       calcium-vitamin D 500-200 MG-UNIT tablet  Commonly known as:  OSCAL WITH D  Take 2 tablets by mouth daily.     CENTRUM SILVER ADULT 50+ PO  Take 1 tablet by mouth daily.      doxepin 50 MG capsule  Commonly known as:  SINEQUAN  Take 50 mg by mouth at bedtime.     esomeprazole 20 MG capsule  Commonly known as:  NEXIUM  Take 20 mg by mouth every morning.     gabapentin 600 MG tablet  Commonly known as:  NEURONTIN  Take 600 mg by mouth every morning.     levothyroxine 112 MCG tablet  Commonly known as:  SYNTHROID, LEVOTHROID  Take 112 mcg by mouth daily before breakfast.     lisinopril 5 MG tablet  Commonly known as:  PRINIVIL,ZESTRIL  Take 5 mg by mouth daily.     morphine 30 MG 12 hr tablet  Commonly known as:  MS CONTIN  Take 30 mg by mouth every 12 (twelve) hours.      Oxycodone 10 mg PO Q4-6 hours prn for pain    oxybutynin 5 MG tablet  Commonly known as:  DITROPAN  Take 5 mg by mouth 2 (two) times daily.        PARoxetine 30 MG tablet  Commonly known as:  PAXIL  Take 30 mg by mouth every morning.     potassium chloride 10 MEQ tablet  Commonly known as:  K-DUR,KLOR-CON  Take 10 mEq by mouth daily.     pravastatin 40 MG tablet  Commonly known as:  PRAVACHOL  Take 40 mg by mouth at bedtime.     PRILOSEC 20 MG capsule  Generic drug:  omeprazole  Take 20 mg by mouth at bedtime as needed.        Diagnostic Studies: Dg Knee Right Port  07/08/2015  CLINICAL DATA:  Post op right knee replacement EXAM: PORTABLE RIGHT KNEE - 1-2 VIEW COMPARISON:  Check FINDINGS: RIGHT knee total arthroplasty. Soft tissue gas and skin staples noted. Surgical drains present. No fracture dislocation. IMPRESSION: No complication following RIGHT knee total arthroplasty. Electronically Signed   By: Suzy Bouchard M.D.   On: 07/08/2015 11:27    Disposition: [facility]      Discharge Instructions    Call MD / Call 911    Complete by:  As directed   If you experience chest pain or shortness of breath, CALL 911 and be transported to the hospital emergency room.  If you develope a fever above 101 F, pus (white drainage) or increased drainage or  redness at the wound, or calf pain, call your surgeon's office.     Constipation Prevention    Complete by:  As directed   Drink plenty of fluids.  Prune juice may be helpful.  You may use a stool softener, such  as Colace (over the counter) 100 mg twice a day.  Use MiraLax (over the counter) for constipation as needed.     Diet - low sodium heart healthy    Complete by:  As directed      Discharge instructions    Complete by:  As directed   Continue 50% partial weightbearing on the right lower extremity 1 month postop. Continue to use TED stockings until follow-up. Patient may remove them at night for sleep. Elevate the right lower extremity whenever possible. Continue to use knee immobilizer at night or when lying in bed or when elevating the operative leg. The patient may remove the knee immobilizer to perform exercises or sit in a chair. Continue using the TENS unit and Polar Care for comfort. Keep incision clean and dry. Cover the right knee incision during showers with a plastic bag or Saran wrap. Take ECASA 325mg  PO BID for 6 weeks for blood clot prevention. Continue to work on knee range of motion exercises at rehab as instructed by physical therapy. Continue to use a walker for assistance with ambulation until follow-up.      Do not put a pillow under the knee. Place it under the heel.    Complete by:  As directed      Driving restrictions    Complete by:  As directed   No driving for 8 weeks     Increase activity slowly as tolerated    Complete by:  As directed      TED hose    Complete by:  As directed   Use stockings (TED hose) for 2 weeks on both leg(s).  You may remove them at night for sleeping.              Signed: Thornton Park ,MD 07/09/2015, 4:27 PM

## 2015-07-09 NOTE — Progress Notes (Signed)
Pt. Resting quietly after CPM removed and pain meds administered. Will reapply CPM in the AM.

## 2015-07-09 NOTE — Progress Notes (Signed)
Subjective:  Postoperative day #1 status post right total knee arthroplasty.Patient reports pain as mild.  Patient fascia difficult night sleeping last night. She was able to get out of bed today however. She was evaluated by physical and the patient therapy today. Her Foley catheter has been removed. She has completed 24 hours of postop antibiotics. She is starting Lovenox for DVT prophylaxis.  Objective:   VITALS:   Filed Vitals:   07/08/15 2100 07/09/15 0359 07/09/15 0750 07/09/15 1634  BP: 131/63 133/63 158/57 111/54  Pulse: 88 88 78 81  Temp: 98.6 F (37 C) 98.9 F (37.2 C) 98.1 F (36.7 C) 98.2 F (36.8 C)  TempSrc: Oral Oral Oral Oral  Resp:  16 18 12   Height:      Weight:      SpO2: 98% 95% 97% 95%    PHYSICAL EXAM:  Right lower extremity: Patient's dressing is clean dry and intact. Her leg compartments are soft and compressible. She has intact sensation light touch in palpable pedal pulses. She is intact motor function and can flex and extend her toes and dorsiflex and plantarflex her ankle without significant pain. She has a knee immobilizer in place without roll under her heel. She has foot pumps on as well as TED stockings.   LABS  Results for orders placed or performed during the hospital encounter of 07/08/15 (from the past 24 hour(s))  CBC     Status: Abnormal   Collection Time: 07/09/15  4:26 AM  Result Value Ref Range   WBC 9.4 3.6 - 11.0 K/uL   RBC 3.34 (L) 3.80 - 5.20 MIL/uL   Hemoglobin 9.9 (L) 12.0 - 16.0 g/dL   HCT 29.2 (L) 35.0 - 47.0 %   MCV 87.6 80.0 - 100.0 fL   MCH 29.8 26.0 - 34.0 pg   MCHC 34.0 32.0 - 36.0 g/dL   RDW 13.5 11.5 - 14.5 %   Platelets 152 150 - 440 K/uL  Basic metabolic panel     Status: Abnormal   Collection Time: 07/09/15  4:26 AM  Result Value Ref Range   Sodium 130 (L) 135 - 145 mmol/L   Potassium 4.6 3.5 - 5.1 mmol/L   Chloride 99 (L) 101 - 111 mmol/L   CO2 24 22 - 32 mmol/L   Glucose, Bld 135 (H) 65 - 99 mg/dL   BUN  9 6 - 20 mg/dL   Creatinine, Ser 1.00 0.44 - 1.00 mg/dL   Calcium 7.8 (L) 8.9 - 10.3 mg/dL   GFR calc non Af Amer 54 (L) >60 mL/min   GFR calc Af Amer >60 >60 mL/min   Anion gap 7 5 - 15    Dg Knee Right Port  07/08/2015  CLINICAL DATA:  Post op right knee replacement EXAM: PORTABLE RIGHT KNEE - 1-2 VIEW COMPARISON:  Check FINDINGS: RIGHT knee total arthroplasty. Soft tissue gas and skin staples noted. Surgical drains present. No fracture dislocation. IMPRESSION: No complication following RIGHT knee total arthroplasty. Electronically Signed   By: Suzy Bouchard M.D.   On: 07/08/2015 11:27    Assessment/Plan: 1 Day Post-Op   Active Problems:   History of total knee arthroplasty  Patient doing well following which following right total knee arthroplasty.   I removed the patient's Hemovac drain. I checked the ends of the drain following removal and there is no evidence of breakage. Patient will continue physical and occupational therapy. Her pain is well-controlled on current pain management. Patient will likely be discharged on  Sunday to [place].    [place] , MD 07/09/2015, 4:51 PM

## 2015-07-09 NOTE — Evaluation (Signed)
Physical Therapy Evaluation Patient Details Name: Sara Greene MRN: [id number] DOB: September 30, 1940 Today's Date: 07/09/2015   History of Present Illness  Pt is a 75 y.o. F who received R TKA on 4/20. Pt has hx of HTN, osteoporosis, depression, and A-fib. Pt very active.  Clinical Impression  Pt is a pleasant 75 y.o. F POD #1 R TKA. Prior to admission, pt lived at home with husband and performed all ADLs independently. Pt used walker and cane for ambulation. Pt awake and oriented during evaluation. Pt demonstrated good B UE and L LE strength. R LE strength limited by pain. Pt able to perform bed mobility with use of bed rails and mod assist. Pt able to transfer from EOB using RW and mod assist. Pt ambulated approx 3 ft with RW and mod assist. Pt performed supine there-ex on R LE with mod to no assist. Knee immobilizer donned for all mobility and doffed for there-ex. Pt demonstrates deficits in strength, ROM, and mobility. Pt would benefit from further skilled PT to address deficits; recommend pt sent to SNF after discharge from acute hospitalization.     Follow Up Recommendations SNF    Equipment Recommendations       Recommendations for Other Services       Precautions / Restrictions Precautions Precautions: Fall Restrictions Weight Bearing Restrictions: Yes Other Position/Activity Restrictions: PWB      Mobility  Bed Mobility Overal bed mobility: Needs Assistance Bed Mobility: Supine to Sit     Supine to sit: Mod assist     General bed mobility comments: Pt able to perform bed mobility using bed rails and mod assist.Pt required assist with moving R LE. Pt provided heavy cues regarding hand placement during mobility. Pt required increased time to upright trunk.   Transfers Overall transfer level: Needs assistance Equipment used: Rolling walker (2 wheeled) Transfers: Sit to/from Stand Sit to Stand: Mod assist         General transfer comment: Pt able to transfer from EOB using  RW and mod assist. Pt required increased time to stand and maintain WB status. Pt required heavy use of B UE on RW during transfer. Pt instructed to prop R LE prior to sitting/standing.  Ambulation/Gait Ambulation/Gait assistance: Mod assist Ambulation Distance (Feet): 3 Feet Assistive device: Rolling walker (2 wheeled) Gait Pattern/deviations: Step-to pattern;Shuffle Gait velocity: slow   General Gait Details: Pt able to ambulate approx 3 ft using RW and mod assist. Pt demonstrated step-to shuffle gait with slow gait speed. Pt maintained PWB on R LE t/o ambulation. Pt instructed on proper walker and foot placement. Ambulation limited by pts pain/mobility.  Stairs            Wheelchair Mobility    Modified Rankin (Stroke Patients Only)       Balance Overall balance assessment: Needs assistance Sitting-balance support: Bilateral upper extremity supported;Feet supported Sitting balance-Leahy Scale: Good Sitting balance - Comments: Pt demonstrates good sitting balance with use of B UE and feet supported. Able to maintain for approx 3 mins. at EOB   Standing balance support: Bilateral upper extremity supported Standing balance-Leahy Scale: Fair Standing balance comment: Pt able to maintain standing balance with RW and CGA. Pt required increased time to find balance when standing to maintain WB precautions.                              Pertinent Vitals/Pain Pain Assessment: 0-10 Pain Score: 5  Pain Location:  R Knee Pain Descriptors / Indicators: Operative site guarding Pain Intervention(s): Limited activity within patient's tolerance;Premedicated before session    Home Living Family/patient expects to be discharged to:: Private residence Living Arrangements: Spouse/significant other Available Help at Discharge: Family Type of Home: House Home Access: Level entry     Home Layout: One level Home Equipment: Environmental consultant - 4 wheels;Walker - 2 wheels;Cane - single  point;Bedside commode Additional Comments: Pt lives at home with husband who is around during the day to assist.     Prior Function Level of Independence: Independent with assistive device(s)         Comments: Pt able to perform all ADLs independently. Pt ambulated with RW in house and SPC in the community.      Hand Dominance        Extremity/Trunk Assessment   Upper Extremity Assessment: RUE deficits/detail;LUE deficits/detail RUE Deficits / Details: R UE grossly 4/5 strength, good shoulder flex ROM     LUE Deficits / Details: L UE grossly 4/5 strength, good shoulder flex ROM   Lower Extremity Assessment: RLE deficits/detail;LLE deficits/detail RLE Deficits / Details: R LE grossly 3-/5. Limited ankle ROM d/t prev. injury. Assessment limited by pain. LLE Deficits / Details: L LE grossly 5/5 strength.      Communication   Communication: No difficulties  Cognition Arousal/Alertness: Awake/alert Behavior During Therapy: WFL for tasks assessed/performed Overall Cognitive Status: Within Functional Limits for tasks assessed                      General Comments      Exercises Total Joint Exercises Goniometric ROM: R LE AAROM: 10 - 55 degrees (will reassess flexion this afternoon) Other Exercises Other Exercises: Pt performed supine ther-ex on R LE including ankle pumps and quad sets with no assist, hip abd and SAQ with min assist, and SLR with mod assist. All ther-ex performed x10 reps except SLR x5 due to patient's pain.       Assessment/Plan    PT Assessment Patient needs continued PT services  PT Diagnosis Difficulty walking;Abnormality of gait;Generalized weakness;Acute pain   PT Problem List Decreased strength;Decreased range of motion;Decreased activity tolerance;Decreased balance;Decreased mobility  PT Treatment Interventions DME instruction;Gait training;Therapeutic activities;Therapeutic exercise;Balance training   PT Goals (Current goals can be  found in the Care Plan section) Acute Rehab PT Goals Patient Stated Goal: to be independent with all ADLs PT Goal Formulation: With patient Time For Goal Achievement: 07/23/15 Potential to Achieve Goals: Good    Frequency BID   Barriers to discharge        Co-evaluation               End of Session Equipment Utilized During Treatment: Gait belt;Right knee immobilizer Activity Tolerance: Patient tolerated treatment well Patient left: in chair;with call bell/phone within reach;with chair alarm set;with family/visitor present           Time: 1010-1046 PT Time Calculation (min) (ACUTE ONLY): 36 min   Charges:         PT G Codes:        Sherral Hammers 07/26/15, 11:28 AM M. Barnett Abu, SPT

## 2015-07-09 NOTE — NC FL2 (Signed)
[place] LEVEL OF CARE SCREENING TOOL     IDENTIFICATION  Patient Name: Sara Greene Birthdate: May 23, 1940 Sex: female Admission Date (Current Location): 07/08/2015  [place] and [place] Number:  Engineering geologist and Address:  [facility], [address], [place],  [address]      Provider Number: [id number]  Attending Physician Name and Address:  Thornton Park, MD  Relative Name and Phone Number:       Current Level of Care: Hospital Recommended Level of Care: [place] Prior Approval Number:    Date Approved/Denied:   PASRR Number:  ([id number])  Discharge Plan: SNF    Current Diagnoses: Patient Active Problem List   Diagnosis Date Noted  . History of total knee arthroplasty 07/08/2015   Hypothyroidism 09/27/2010   Essential hypertension 09/27/2010   Atrial fibrillation , unspecified (CMS-HCC) 09/27/2010   Osteoporosis 09/27/2010   Neck pain 09/27/2010   Back pain, unspecified 09/27/2010   Depression 09/27/2010   Vitamin D deficiency 09/27/2010   Hyperlipidemia, unspecified    Reflux 03/16/2011   Urine incontinence 09/18/2011   Osteoporosis 04/26/2012   Hyponatremia       Orientation RESPIRATION BLADDER Height & Weight     Self, Time, Situation, Place  Normal Continent Weight: 192 lb (87.091 kg) Height:  5\' 5"  (165.1 cm)  BEHAVIORAL SYMPTOMS/MOOD NEUROLOGICAL BOWEL NUTRITION STATUS   (none )  (none ) Continent Diet (Regular Diet )  AMBULATORY STATUS COMMUNICATION OF NEEDS Skin   Extensive Assist Verbally Surgical wounds (Incision: Right Leg )                       Personal Care Assistance Level of Assistance  Bathing, Feeding, Dressing Bathing Assistance: Limited assistance Feeding assistance: Independent Dressing Assistance: Limited assistance     Functional Limitations Info  Sight, Hearing, Speech Sight Info: Adequate Hearing Info: Adequate Speech Info: Adequate     SPECIAL CARE FACTORS FREQUENCY  PT (By licensed PT), OT (By licensed OT)     PT Frequency:  (5) OT Frequency:  (5)            Contractures      Additional Factors Info  Code Status, Allergies Code Status Info:  (Not on File ) Allergies Info:  (Lipitor, Shellfish Allergy )           Current Medications (07/09/2015):  This is the current hospital active medication list Current Facility-Administered Medications  Medication Dose Route Frequency Provider Last Rate Last Dose  . 0.9 %  sodium chloride infusion   Intravenous Continuous Thornton Park, MD 75 mL/hr at 07/09/15 [phone]    . 0.9 %  sodium chloride infusion   Intravenous Once Thornton Park, MD      . acetaminophen (TYLENOL) tablet 650 mg  650 mg Oral Q6H PRN Thornton Park, MD       Or  . acetaminophen (TYLENOL) suppository 650 mg  650 mg Rectal Q6H PRN Thornton Park, MD      . alum & mag hydroxide-simeth (MAALOX/MYLANTA) 200-200-20 MG/5ML suspension 30 mL  30 mL Oral Q4H PRN Thornton Park, MD      . aspirin EC tablet 81 mg  81 mg Oral Daily Thornton Park, MD   81 mg at 07/09/15 0930  . bisacodyl (DULCOLAX) EC tablet 5 mg  5 mg Oral Daily PRN Thornton Park, MD      . calcium-vitamin D (OSCAL WITH D) 500-200 MG-UNIT per tablet 2 tablet  2 tablet Oral Daily Thornton Park, MD   2 tablet at 07/09/15 0931  . celecoxib (CELEBREX) capsule 200 mg  200 mg Oral Q12H Thornton Park, MD   200 mg at 07/09/15 0931  . docusate sodium (COLACE) capsule 100 mg  100 mg Oral BID Thornton Park, MD   100 mg at 07/09/15 0930  . doxepin (SINEQUAN) capsule 50 mg  50 mg Oral QHS Thornton Park, MD   50 mg at 07/08/15 2107  . enoxaparin (LOVENOX) injection 30 mg  30 mg Subcutaneous Q12H Thornton Park, MD   30 mg at 07/09/15 0929  . gabapentin (NEURONTIN) tablet 600 mg  600 mg Oral Tonette Lederer, MD   600 mg at 07/09/15 0552  . HYDROmorphone (DILAUDID) injection 1 mg  1 mg Intravenous Q2H PRN Thornton Park, MD   1 mg  at 07/09/15 0014  . levothyroxine (SYNTHROID, LEVOTHROID) tablet 112 mcg  112 mcg Oral QAC breakfast Thornton Park, MD   112 mcg at 07/09/15 0929  . lisinopril (PRINIVIL,ZESTRIL) tablet 5 mg  5 mg Oral Daily Thornton Park, MD   5 mg at 07/09/15 0930  . magnesium citrate solution 1 Bottle  1 Bottle Oral Once PRN Thornton Park, MD      . magnesium hydroxide (MILK OF MAGNESIA) suspension 30 mL  30 mL Oral Daily PRN Thornton Park, MD      . menthol-cetylpyridinium (CEPACOL) lozenge 3 mg  1 lozenge Oral PRN Thornton Park, MD       Or  . phenol (CHLORASEPTIC) mouth spray 1 spray  1 spray Mouth/Throat PRN Thornton Park, MD      . methocarbamol (ROBAXIN) tablet 500 mg  500 mg Oral Q6H PRN Thornton Park, MD   500 mg at 07/08/15 1636   Or  . methocarbamol (ROBAXIN) 500 mg in dextrose 5 % 50 mL IVPB  500 mg Intravenous Q6H PRN Thornton Park, MD      . metoCLOPramide (REGLAN) tablet 5-10 mg  5-10 mg Oral Q8H PRN Thornton Park, MD       Or  . metoCLOPramide (REGLAN) injection 5-10 mg  5-10 mg Intravenous Q8H PRN Thornton Park, MD      . morphine (MS CONTIN) 12 hr tablet 30 mg  30 mg Oral Q12H Thornton Park, MD   30 mg at 07/09/15 0931  . multivitamin with minerals tablet 1 tablet  1 tablet Oral Daily Thornton Park, MD   1 tablet at 07/09/15 [phone]  . ondansetron (ZOFRAN) tablet 4 mg  4 mg Oral Q6H PRN Thornton Park, MD       Or  . ondansetron [facility]) injection 4 mg  4 mg Intravenous Q6H PRN Thornton Park, MD   4 mg at 07/09/15 0948  . oxybutynin (DITROPAN) tablet 5 mg  5 mg Oral BID Thornton Park, MD   5 mg at 07/09/15 0930  . oxyCODONE (Oxy IR/ROXICODONE) immediate release tablet 10-15 mg  10-15 mg Oral Q3H PRN Thornton Park, MD   15 mg at 07/09/15 0412  . pantoprazole (PROTONIX) EC tablet 40 mg  40 mg Oral Daily Thornton Park, MD   40 mg at 07/09/15 0930  . PARoxetine (PAXIL) tablet 30 mg  30 mg Oral Tonette Lederer, MD   30 mg at 07/09/15 0552  . potassium  chloride (K-DUR,KLOR-CON) CR tablet 10 mEq  10 mEq Oral Daily Thornton Park, MD   10 mEq at 07/09/15 0930  . pravastatin (PRAVACHOL) tablet 40 mg  40 mg Oral QHS Lennette Bihari  Mack Guise, MD      . [facility]) tablet 8.6 mg  1 tablet Oral BID Thornton Park, MD   8.6 mg at 07/09/15 0930     Discharge Medications: Please see discharge summary for a list of discharge medications.  Relevant Imaging Results:  Relevant Lab Results:   Additional Information  (SSN: [id number])  Loralyn Freshwater, LCSW

## 2015-07-09 NOTE — Progress Notes (Signed)
Pt. Crying with pain 10 out of 10. Can't tolerate CPM. CPM machine removed and pain med given.

## 2015-07-10 LAB — CBC
HCT: 24 % — ABNORMAL LOW (ref 35.0–47.0)
HEMOGLOBIN: 8.2 g/dL — AB (ref 12.0–16.0)
MCH: 29.3 pg (ref 26.0–34.0)
MCHC: 34.3 g/dL (ref 32.0–36.0)
MCV: 85.7 fL (ref 80.0–100.0)
PLATELETS: 139 10*3/uL — AB (ref 150–440)
RBC: 2.8 MIL/uL — ABNORMAL LOW (ref 3.80–5.20)
RDW: 13.5 % (ref 11.5–14.5)
WBC: 8.3 10*3/uL (ref 3.6–11.0)

## 2015-07-10 NOTE — Progress Notes (Addendum)
Physical Therapy Treatment Patient Details Name: Sara Greene MRN: [id number] DOB: 01-May-1940 Today's Date: 07/10/2015    History of Present Illness Pt is a 75 y.o. F who received R TKA on 4/20. Pt has hx of HTN, osteoporosis, depression, and A-fib. Pt very active.    PT Comments    Pt reports 7/10 pain right knee; pleasant noted to be tolerating pain well. Participates well with stretching and strengthening exercises with education to both pt and family on proper technique, frequency and duration. Improved quad set noted with re education today. Pt extension on R limited by 10 degrees; education provided on need/benefit for full extension in regards to ambulation. Pt/family understand. Improving R knee flexion to 80 degrees today. Progressing ambulation distance as well with compliance on R PWB status. Pt remains up in chair. Clarification of discharge status with SW; pt is to be discharged tomorrow 07/11/2015. Pt notified. Continue PT for improved range of motion, strength and all functional mobility.    Follow Up Recommendations  SNF     Equipment Recommendations       Recommendations for Other Services       Precautions / Restrictions Precautions Precautions: Fall Required Braces or Orthoses: Knee Immobilizer - Right Restrictions Weight Bearing Restrictions: Yes RLE Weight Bearing: Partial weight bearing    Mobility  Bed Mobility Overal bed mobility: Needs Assistance Bed Mobility: Sit to Supine     Supine to sit: Min assist        Transfers Overall transfer level: Needs assistance Equipment used: Rolling walker (2 wheeled) Transfers: Sit to/from Stand Sit to Stand: Min guard         General transfer comment: Cues for safe hand placement  Ambulation/Gait Ambulation/Gait assistance: Min guard Ambulation Distance (Feet): 40 Feet Assistive device: Rolling walker (2 wheeled) Gait Pattern/deviations: Step-to pattern;Decreased step length - left (PWB R) Gait  velocity: reduced Gait velocity interpretation: <1.8 ft/sec, indicative of risk for recurrent falls General Gait Details: cues for improved rw placement further out to allow for step up to hands versus front of rw    Stairs            Wheelchair Mobility    Modified Rankin (Stroke Patients Only)       Balance Overall balance assessment: Needs assistance Sitting-balance support: Feet supported Sitting balance-Leahy Scale: Good     Standing balance support: Bilateral upper extremity supported Standing balance-Leahy Scale: Fair                      Cognition Arousal/Alertness: Awake/alert Behavior During Therapy: WFL for tasks assessed/performed Overall Cognitive Status: Within Functional Limits for tasks assessed                      Exercises Total Joint Exercises Ankle Circles/Pumps: AROM;Both;20 reps (long sit) Quad Sets: Strengthening;Both;20 reps (long sit, ankle roll under R ) Gluteal Sets: Strengthening;Both;20 reps (long sit) Heel Slides: AAROM;10 reps;Right (long sit, education on self assist at thigh) Long Arc Quad: AAROM;Right;10 reps (2 sets) Knee Flexion: AAROM;Right;Other reps (comment);Seated (10 minutes, ) Goniometric ROM: 10-80 Other Exercises Other Exercises: Pt/family education on all exercises    General Comments        Pertinent Vitals/Pain Pain Assessment: 0-10 Pain Score: 7  Pain Location: R knee Pain Descriptors / Indicators: Aching;Constant Pain Intervention(s): Limited activity within patient's tolerance;Monitored during session;Premedicated before session;Ice applied    Home Living  Prior Function            PT Goals (current goals can now be found in the care plan section) Acute Rehab PT Goals Patient Stated Goal: To be independent with ADLs. Progress towards PT goals: Progressing toward goals    Frequency  BID    PT Plan Current plan remains appropriate    Co-evaluation              End of Session Equipment Utilized During Treatment: Gait belt;Right knee immobilizer Activity Tolerance: Patient tolerated treatment well Patient left: in chair;with call bell/phone within reach;with chair alarm set;with family/visitor present;with SCD's reapplied (polar care in placed)     Time: [id number] PT Time Calculation (min) (ACUTE ONLY): 38 min  Charges:  $Gait Training: 8-22 mins $Therapeutic Exercise: 23-37 mins                    G Codes:      Charlaine Dalton, PTA 07/10/2015, 10:40 AM

## 2015-07-10 NOTE — Progress Notes (Signed)
Physical Therapy Treatment Patient Details Name: Sara Greene MRN: [id number] DOB: 09/02/1940 Today's Date: 07/10/2015    History of Present Illness Pt is a 75 y.o. F who received R TKA on 4/20. Pt has hx of HTN, osteoporosis, depression, and A-fib. Pt very active.    PT Comments    Stopped in pt room to notify pt of discharge tomorrow. Pt currently on bedside commode and finished. Pt wishes ambulation and back to bed. Pt participates in ambulation progressing distance. Continues compliant with R PWB; occasional cues to avoid stepping too far into rolling walker. Pt returns to bed with Min A for Bilateral lower extremities using hook technique and requires trapeze for repositioning in bed. Pt placed on large ankle roll; tolerating well. Continue PT to progress range of motion, strength, quality of ambulation and all functional mobility.   Follow Up Recommendations  SNF     Equipment Recommendations       Recommendations for Other Services       Precautions / Restrictions Precautions Precautions: Fall Required Braces or Orthoses: Knee Immobilizer - Right Restrictions Weight Bearing Restrictions: Yes RLE Weight Bearing: Partial weight bearing    Mobility  Bed Mobility Overal bed mobility: Needs Assistance Bed Mobility: Supine to Sit       Sit to supine: Min assist   General bed mobility comments: Min A for LEs with hook technique; use of trapeze to reposition self in bed  Transfers Overall transfer level: Needs assistance Equipment used: Rolling walker (2 wheeled) Transfers: Sit to/from Stand (from bedside commode) Sit to Stand: Min guard         General transfer comment: Cues for safe hand placement  Ambulation/Gait Ambulation/Gait assistance: Min guard Ambulation Distance (Feet): 75 Feet Assistive device: Rolling walker (2 wheeled) Gait Pattern/deviations: Step-to pattern (PWB R) Gait velocity: reduced Gait velocity interpretation: <1.8 ft/sec, indicative of  risk for recurrent falls General Gait Details: occasional cues for body position in regards to rw (further extension of rw to avoid stepping in too close)   Science writer    Modified Rankin (Stroke Patients Only)       Balance Overall balance assessment: Needs assistance Sitting-balance support: Feet supported Sitting balance-Leahy Scale: Good     Standing balance support: Bilateral upper extremity supported Standing balance-Leahy Scale: Fair                      Cognition Arousal/Alertness: Awake/alert Behavior During Therapy: WFL for tasks assessed/performed Overall Cognitive Status: Within Functional Limits for tasks assessed                      Exercises Total Joint Exercises Ankle Circles/Pumps: AROM;Both;20 reps (long sit) Quad Sets: Strengthening;Both;20 reps (long sit, ankle roll under R ) Gluteal Sets: Strengthening;Both;20 reps (long sit) Heel Slides: AAROM;10 reps;Right (long sit, education on self assist at thigh) Long Arc Quad: AAROM;Right;10 reps (2 sets) Knee Flexion: AAROM;Right;Other reps (comment);Seated (10 minutes, ) Goniometric ROM: 10-80 Other Exercises Other Exercises: Family assisted pt with personal hygiene post toileting    General Comments        Pertinent Vitals/Pain Pain Assessment: 0-10 Pain Score: 5  Pain Location: R knee Pain Descriptors / Indicators: Aching Pain Intervention(s): Patient requesting pain meds-RN notified;Ice applied    Home Living  Prior Function            PT Goals (current goals can now be found in the care plan section) Progress towards PT goals: Progressing toward goals    Frequency  BID    PT Plan Current plan remains appropriate    Co-evaluation             End of Session Equipment Utilized During Treatment: Gait belt;Right knee immobilizer Activity Tolerance: Patient tolerated treatment well Patient left: in  bed;with call bell/phone within reach;with family/visitor present;with SCD's reapplied (polar care in place; family/pt refused bed alarm. Will call)     Time: [id number] PT Time Calculation (min) (ACUTE ONLY): 26 min  Charges:  $Gait Training: 8-22 mins $Therapeutic Exercise: 23-37 mins $Therapeutic Activity: 8-22 mins                    G Codes:      Charlaine Dalton, PTA 07/10/2015, 1:52 PM

## 2015-07-10 NOTE — Progress Notes (Signed)
Occupational Therapy Treatment Patient Details Name: Sara Greene MRN: [id number] DOB: Mar 13, 1941 Today's Date: 07/10/2015    History of present illness Pt is a 75 y.o. F who received R TKA on 4/20. Pt has hx of HTN, osteoporosis, depression, and A-fib. Pt very active.   OT comments  Patient is progressing well towards goals, she was able to recall use of adaptive equipment from the day before and able to demo with setup, cues and min guard.  She was able to don and doff socks with A/E, don underwear and perform clothing negotiation with min guard and cues for partial weight bearing.  Improved with transfers overall min guard.    Follow Up Recommendations  Home health OT    Equipment Recommendations       Recommendations for Other Services      Precautions / Restrictions Precautions Precautions: Fall Required Braces or Orthoses: Knee Immobilizer - Right Restrictions Weight Bearing Restrictions: Yes RLE Weight Bearing: Partial weight bearing       Mobility Bed Mobility Overal bed mobility: Needs Assistance Bed Mobility: Sit to Supine     Supine to sit: Min assist        Transfers Overall transfer level: Needs assistance Equipment used: Rolling walker (2 wheeled) Transfers: Sit to/from Stand Sit to Stand: Min guard              Balance                                   ADL Overall ADL's : Needs assistance/impaired Eating/Feeding: Independent                   Lower Body Dressing: Set up;Minimal assistance   Toilet Transfer: Min guard   Toileting- Clothing Manipulation and Hygiene: Set up;Min guard                Vision                     Perception     Praxis      Cognition   Behavior During Therapy: WFL for tasks assessed/performed Overall Cognitive Status: Within Functional Limits for tasks assessed                       Extremity/Trunk Assessment               Exercises     Shoulder  Instructions       General Comments      Pertinent Vitals/ Pain       Pain Assessment: 0-10 Pain Score: 7  Pain Location: right knee Pain Descriptors / Indicators: Operative site guarding Pain Intervention(s): Limited activity within patient's tolerance;Repositioned;Monitored during session;Premedicated before session  Home Living                                          Prior Functioning/Environment              Frequency Min 2X/week     Progress Toward Goals  OT Goals(current goals can now be found in the care plan section)  Progress towards OT goals: Progressing toward goals  Acute Rehab OT Goals Patient Stated Goal: To be independent with ADLs. OT Goal Formulation: With patient/family Time For Goal Achievement: 07/23/15 Potential to Achieve Goals:  Good  Plan Discharge plan remains appropriate    Co-evaluation                 End of Session Equipment Utilized During Treatment: Gait belt;Rolling walker;Right knee immobilizer   Activity Tolerance Patient tolerated treatment well   Patient Left in chair;with call bell/phone within reach;with family/visitor present;with chair alarm set   Nurse Communication          Time: [id number] OT Time Calculation (min): 25 min  Charges: OT General Charges $OT Visit: 1 Procedure OT Treatments $Self Care/Home Management : 23-37 mins Jerremy Maione T Chanel Mcadams, OTR/L, CLT  Glenn Christo 07/10/2015, 9:48 AM

## 2015-07-10 NOTE — Progress Notes (Signed)
  Subjective:  POD #2 s/p right TKA.  Patient reports pain as mild.  Slept well overnight and had a BM.  Patient is OOB to chair eating lunch.  She has no complaints.  Objective:   VITALS:   Filed Vitals:   07/09/15 1634 07/09/15 1949 07/10/15 0326 07/10/15 0755  BP: 111/54 114/55 126/56 140/57  Pulse: 81 82 85 77  Temp: 98.2 F (36.8 C) 98.5 F (36.9 C) 97.8 F (36.6 C) 98 F (36.7 C)  TempSrc: Oral Oral Oral Oral  Resp: 12 14 16 16   Height:      Weight:      SpO2: 95% 98% 97% 100%    PHYSICAL EXAM:   Right knee:  Dressing is C/D/I.  She has intact sensation to light touch throughout the right lower extremity. Patient can flex and extend her toes and dorsiflex and plantarflex her ankle. She has palpable pedal pulses. Her leg compartments are soft and compressible. Patient has a towel roll under her heel. Her Polar Care is on the right knee. She has her legs elevated in a chair.  LABS  Results for orders placed or performed during the hospital encounter of 07/08/15 (from the past 24 hour(s))  CBC     Status: Abnormal   Collection Time: 07/10/15  4:10 AM  Result Value Ref Range   WBC 8.3 3.6 - 11.0 K/uL   RBC 2.80 (L) 3.80 - 5.20 MIL/uL   Hemoglobin 8.2 (L) 12.0 - 16.0 g/dL   HCT 24.0 (L) 35.0 - 47.0 %   MCV 85.7 80.0 - 100.0 fL   MCH 29.3 26.0 - 34.0 pg   MCHC 34.3 32.0 - 36.0 g/dL   RDW 13.5 11.5 - 14.5 %   Platelets 139 (L) 150 - 440 K/uL    No results found.  Assessment/Plan: 2 Days Post-Op   Active Problems:   History of total knee arthroplasty  Patient doing well on postop day #2. Her hemoglobin is lower today but her vital signs remain stable. Labs will be rechecked tomorrow morning. Patient will continue with physical and occupational therapy. Possible discharge tomorrow to [facility]    Thornton Park , MD 07/10/2015, 12:55 PM

## 2015-07-11 LAB — CBC
HCT: 21.6 % — ABNORMAL LOW (ref 35.0–47.0)
Hemoglobin: 7.5 g/dL — ABNORMAL LOW (ref 12.0–16.0)
MCH: 30.2 pg (ref 26.0–34.0)
MCHC: 34.7 g/dL (ref 32.0–36.0)
MCV: 87 fL (ref 80.0–100.0)
PLATELETS: 140 10*3/uL — AB (ref 150–440)
RBC: 2.48 MIL/uL — AB (ref 3.80–5.20)
RDW: 13.1 % (ref 11.5–14.5)
WBC: 8 10*3/uL (ref 3.6–11.0)

## 2015-07-11 LAB — PREPARE RBC (CROSSMATCH)

## 2015-07-11 MED ORDER — SODIUM CHLORIDE 0.9 % IV SOLN: Freq: Once | INTRAVENOUS | Status: DC

## 2015-07-11 MED ORDER — ASPIRIN EC 325 MG PO TBEC
325.0000 mg | DELAYED_RELEASE_TABLET | Freq: Two times a day (BID) | ORAL | Status: DC
Start: 2015-07-11 — End: 2015-07-12
  Administered 2015-07-11 – 2015-07-12 (×2): 325 mg via ORAL
  Filled 2015-07-11 (×2): qty 1

## 2015-07-11 NOTE — Progress Notes (Signed)
Physical Therapy Treatment Patient Details Name: Sara Greene MRN: [id number] DOB: 1940/06/04 Today's Date: 07/11/2015    History of Present Illness Pt is a 75 y.o. F who received R TKA on 4/20. Pt has hx of HTN, osteoporosis, depression, and A-fib. Pt very active.    PT Comments    Pt shows great effort t/o session and though she had a transfusion earlier and a lot of pain during ROM actsd she is highly motivated and willing to work hard with PT.  She shows great quad control/strength for POD3, but does have some stiffness and considerable pain with ROM acts.  Pt able to increased ambulation distance and gait and generally is progressing as expected.   Follow Up Recommendations  SNF     Equipment Recommendations       Recommendations for Other Services       Precautions / Restrictions Precautions Precautions: Fall Restrictions RLE Weight Bearing: Partial weight bearing    Mobility  Bed Mobility Overal bed mobility: Needs Assistance Bed Mobility: Supine to Sit     Supine to sit: Min assist     General bed mobility comments: Pt shows great effort with getting to EOB and does not need assist with LEs to get to edge  Transfers Overall transfer level: Needs assistance Equipment used: Rolling walker (2 wheeled) Transfers: Sit to/from Stand Sit to Stand: Min guard         General transfer comment: Pt does well with getting to/from standing with only verbal cuing  Ambulation/Gait Ambulation/Gait assistance: Min guard Ambulation Distance (Feet): 100 Feet Assistive device: Rolling walker (2 wheeled)       General Gait Details: Pt initially slow and guarded with ambulation but once she gets warmed up she is able to maintain consistent forward momentum showing almost no limp/hesitancy and though she fatigues with the effort she ultimately did very well.     Stairs            Wheelchair Mobility    Modified Rankin (Stroke Patients Only)       Balance                                     Cognition Arousal/Alertness: Awake/alert Behavior During Therapy: WFL for tasks assessed/performed Overall Cognitive Status: Within Functional Limits for tasks assessed                      Exercises Total Joint Exercises Ankle Circles/Pumps: AROM;Both;20 reps Quad Sets: Strengthening;Both;20 reps Gluteal Sets: Strengthening;Both;20 reps Short Arc Quad: Strengthening;10 reps Heel Slides: AROM;Strengthening;10 reps Hip ABduction/ADduction: Strengthening;10 reps Straight Leg Raises: Strengthening;10 reps;AROM Knee Flexion: 5 reps;PROM Goniometric ROM: 2-76    General Comments        Pertinent Vitals/Pain Pain Assessment: 0-10 Pain Score: 6  (10/10 wirh ROM acts)    Home Living                      Prior Function            PT Goals (current goals can now be found in the care plan section) Progress towards PT goals: Progressing toward goals    Frequency  BID    PT Plan Current plan remains appropriate    Co-evaluation             End of Session Equipment Utilized During Treatment: Gait belt Activity Tolerance: Patient limited  by pain;Patient tolerated treatment well Patient left: with chair alarm set;with call bell/phone within reach     Time: 1415-1440 PT Time Calculation (min) (ACUTE ONLY): 25 min  Charges:  $Gait Training: 8-22 mins $Therapeutic Exercise: 8-22 mins                    G Codes:     Wayne Both, PT, DPT [phone]  Kreg Shropshire 07/11/2015, 3:22 PM

## 2015-07-11 NOTE — Progress Notes (Signed)
PT Cancellation Note  Patient Details Name: Sara Greene MRN: [id number] DOB: [birth year]   Cancelled Treatment:    Reason Eval/Treat Not Completed: Patient at procedure or test/unavailable Pt getting a blood transfusion at 11:15 this AM.  Will try back later as time allows.   Wayne Both, PT, DPT [phone]  Kreg Shropshire 07/11/2015, 12:57 PM

## 2015-07-11 NOTE — Progress Notes (Signed)
  Subjective:  Day #3 status post right total knee arthroplasty. Patient denies having any significant right knee pain currently. Her pain is well-controlled on the current pain medications. Patient is making progress with physical therapy. Physical therapy has recommended that she go to a skilled nursing facility upon discharge. Patient's hemoglobin today is 7.5. She states she felt dizzy this morning.  Objective:   VITALS:   Filed Vitals:   07/11/15 0349 07/11/15 0814 07/11/15 1026 07/11/15 1100  BP: 137/62 137/59 126/63 127/68  Pulse: 90 87 85 88  Temp: 98.2 F (36.8 C) 98.7 F (37.1 C) 99.3 F (37.4 C) 98.7 F (37.1 C)  TempSrc: Oral Oral Oral Oral  Resp: 18 16 18 19   Height:      Weight:      SpO2: 100% 100% 98% 100%    PHYSICAL EXAM:  Right lower extremity: Patient had her dressing changed by me personally. Her dressing had moderate dried blood from her surgery. Patient had a scant amount of serosanguineous drainage from the midpoint of her incision. There is no erythema or ecchymosis. She has a small effusion. Patient has leg compartments that are soft and compressible. She has intact sensation throughout the right lower extremity. She has palpable pedal pulses. She can actively flex and extend her toes and dorsiflex and plantarflex her ankle without significant pain.  LABS  Results for orders placed or performed during the hospital encounter of 07/08/15 (from the past 24 hour(s))  CBC     Status: Abnormal   Collection Time: 07/11/15  3:32 AM  Result Value Ref Range   WBC 8.0 3.6 - 11.0 K/uL   RBC 2.48 (L) 3.80 - 5.20 MIL/uL   Hemoglobin 7.5 (L) 12.0 - 16.0 g/dL   HCT 21.6 (L) 35.0 - 47.0 %   MCV 87.0 80.0 - 100.0 fL   MCH 30.2 26.0 - 34.0 pg   MCHC 34.7 32.0 - 36.0 g/dL   RDW 13.1 11.5 - 14.5 %   Platelets 140 (L) 150 - 440 K/uL  Prepare RBC     Status: None   Collection Time: 07/11/15  8:41 AM  Result Value Ref Range   Order Confirmation ORDER PROCESSED BY BLOOD  BANK     No results found.  Assessment/Plan: 3 Days Post-Op   Active Problems:   History of total knee arthroplasty  Patient is doing well postop. She has made good progress with physical therapy. Given her hemoglobin of 7.5 combined with dizziness I'm recommending a transfusion of a single unit of packed red blood cells today. I have ordered this and the patient is already receiving her unit. I will continue to monitor her overnight. I'm holding her discharge to [facility] until tomorrow. She will have her hemoglobin and hematocrit rechecked in the morning. Patient will continue physical therapy and occupational therapy after her transfusion. She was encouraged to continue using her incentive spirometry. She understood and is in agreement with this plan.    Thornton Park , MD 07/11/2015, 11:29 AM

## 2015-07-11 NOTE — Clinical Social Work Note (Signed)
Per MD, pt will likely discharge tomorrow (Monday, 07/12/2015). Pt is aware and aggreeable. Pt shared that she updated her daughter. CSW updated facility. CSW will continue to follow.   Darden Dates, MSW, LCSW  Clinical Social Worker [phone]

## 2015-07-12 LAB — CBC
HEMATOCRIT: 26.4 % — AB (ref 35.0–47.0)
Hemoglobin: 9 g/dL — ABNORMAL LOW (ref 12.0–16.0)
MCH: 29.8 pg (ref 26.0–34.0)
MCHC: 34.1 g/dL (ref 32.0–36.0)
MCV: 87.3 fL (ref 80.0–100.0)
PLATELETS: 186 10*3/uL (ref 150–440)
RBC: 3.03 MIL/uL — ABNORMAL LOW (ref 3.80–5.20)
RDW: 13.1 % (ref 11.5–14.5)
WBC: 7.3 10*3/uL (ref 3.6–11.0)

## 2015-07-12 LAB — TYPE AND SCREEN
ABO/RH(D): O POS
Antibody Screen: NEGATIVE
UNIT DIVISION: 0

## 2015-07-12 MED ORDER — ASPIRIN 325 MG PO TBEC: 325.0000 mg | DELAYED_RELEASE_TABLET | Freq: Two times a day (BID) | ORAL | Status: DC

## 2015-07-12 MED ORDER — FLEET ENEMA 7-19 GM/118ML RE ENEM
1.0000 | ENEMA | Freq: Once | RECTAL | Status: AC
  Administered 2015-07-12: 1 via RECTAL

## 2015-07-12 NOTE — Clinical Social Work Placement (Signed)
   CLINICAL SOCIAL WORK PLACEMENT  NOTE  Date:  07/12/2015  Patient Details  Name: Sara Greene MRN: [id number] Date of Birth: 1941/01/31  Clinical Social Work is seeking post-discharge placement for this patient at the [place] level of care (*CSW will initial, date and re-position this form in  chart as items are completed):  Yes   Patient/family provided with [facility] Work Department's list of facilities offering this level of care within the geographic area requested by the patient (or if unable, by the patient's family).  Yes   Patient/family informed of their freedom to choose among providers that offer the needed level of care, that participate in Medicare, Medicaid or managed care program needed by the patient, have an available bed and are willing to accept the patient.  Yes   Patient/family informed of [facility]'s ownership interest in [facility] and [facility], as well as of the fact that they are under no obligation to receive care at these facilities.  PASRR submitted to EDS on 07/09/15     PASRR number received on 07/09/15     Existing PASRR number confirmed on       FL2 transmitted to all facilities in geographic area requested by pt/family on 07/09/15     FL2 transmitted to all facilities within larger geographic area on       Patient informed that his/her managed care company has contracts with or will negotiate with certain facilities, including the following:        Yes   Patient/family informed of bed offers received.  Patient chooses bed at  [facility] )     Physician recommends and patient chooses bed at      Patient to be transferred to  [facility] ) on 07/12/15.  Patient to be transferred to facility by  [facility] )     Patient family notified on 07/12/15 of transfer.  Name of family member notified:   (Patient's husband Chrissie Noa is at bedside and aware of D/C today. )     PHYSICIAN      Additional Comment:    _______________________________________________ Loralyn Freshwater, LCSW 07/12/2015, 1:35 PM

## 2015-07-12 NOTE — Progress Notes (Signed)
Physical Therapy Treatment Patient Details Name: Sara Greene MRN: [id number] DOB: Jun 05, 1940 Today's Date: 07/12/2015    History of Present Illness Pt is a 75 y.o. F who received R TKA on 4/20. Pt has hx of HTN, osteoporosis, depression, and A-fib. Pt very active.    PT Comments    Pt able to ambulate 80 feet with RW CGA; limited distance d/t UE fatigue.  Initial vc's for PWB'ing status required but then pt appeared to be able to maintain [place] status.  Plan for pt to discharge to [facility] today.  Recommend to continue to focus on R knee ROM (currently limited d/t pain), R LE strengthening, and independence with functional mobility.   Follow Up Recommendations  SNF     Equipment Recommendations  Rolling walker with 5" wheels    Recommendations for Other Services       Precautions / Restrictions Precautions Precautions: Fall Required Braces or Orthoses: Knee Immobilizer - Right Restrictions Weight Bearing Restrictions: Yes RLE Weight Bearing: Partial weight bearing    Mobility  Bed Mobility Overal bed mobility: Independent Bed Mobility: Supine to Sit     Supine to sit: Independent     General bed mobility comments: Not assessed d/t pt sitting up in chair beginning and end of session  Transfers Overall transfer level: Needs assistance Equipment used: Rolling walker (2 wheeled) Transfers: Sit to/from Stand Sit to Stand: Min guard         General transfer comment: Min vc's for technique with transfers  Ambulation/Gait Ambulation/Gait assistance: Min guard Ambulation Distance (Feet): 80 Feet Assistive device: Rolling walker (2 wheeled)   Gait velocity: reduced   General Gait Details: initial vc's for PWB'ing status required; step to gait pattern; limited distance d/t UE fatigue   Stairs            Wheelchair Mobility    Modified Rankin (Stroke Patients Only)       Balance Overall balance assessment: Needs assistance Sitting-balance support: No  upper extremity supported;Feet supported Sitting balance-Leahy Scale: Good     Standing balance support: Bilateral upper extremity supported (on RW) Standing balance-Leahy Scale: Good                      Cognition Arousal/Alertness: Awake/alert Behavior During Therapy: WFL for tasks assessed/performed Overall Cognitive Status: Within Functional Limits for tasks assessed                      Exercises Total Joint Exercises Long Arc QuadSinclair Ship;Right;10 reps Goniometric ROM: R knee extension 10 degrees short of neutral semi-supine in chair; R knee flexion 85 degrees flexion sitting in chair (limited d/t pain)    General Comments  Pt agreeable to PT session.  Pt's husband present end of session.      Pertinent Vitals/Pain Pain Assessment: 0-10 Pain Score: 8  Pain Location: R knee Pain Descriptors / Indicators: Aching;Sore;Tender Pain Intervention(s): Limited activity within patient's tolerance;Monitored during session;Premedicated before session;Repositioned;Relaxation;Ice applied  Vitals stable and WFL throughout treatment session.    Home Living                      Prior Function            PT Goals (current goals can now be found in the care plan section) Acute Rehab PT Goals Patient Stated Goal: To be independent with ambulation PT Goal Formulation: With patient Time For Goal Achievement: 07/26/15 Potential to Achieve Goals: Good  Progress towards PT goals: Progressing toward goals    Frequency  BID    PT Plan Current plan remains appropriate    Co-evaluation             End of Session Equipment Utilized During Treatment: Gait belt Activity Tolerance: Patient limited by pain;Patient limited by fatigue Patient left: in chair;with call bell/phone within reach;with chair alarm set;with family/visitor present;with SCD's reapplied (R heel elevated via towel roll; polar care in place)     Time: [id number] PT Time Calculation (min)  (ACUTE ONLY): 32 min  Charges:  $Gait Training: 8-22 mins $Therapeutic Exercise: 8-22 mins                    G CodesLeitha Bleak July 13, 2015, 10:34 AM Leitha Bleak, [place]

## 2015-07-12 NOTE — Progress Notes (Signed)
Patient is medically stable for D/C to [facility] today. A new [employer] authorization has been received. Per Kim admissions coordinator at [facility] patient will go to room 201-B. RN will call report at [phone] and arrange EMS after patient has a BM. Clinical Education officer, museum (CSW) sent D/C Summary, FL2 and D/C Packet to [employer] via [employer]. Patient is aware of above. Patient's husband Chrissie Noa is at bedside and aware of above. Please reconsult if future social work needs arise. CSW signing off.   Blima Rich, LCSW [phone]

## 2015-07-12 NOTE — Progress Notes (Addendum)
Patient was not medically stable for D/C on yesterday so [facility] authorization has expired. Clinical Education officer, museum (CSW) started new [employer] authorization this morning and clinicals were faxed in. CSW will continue to follow and assist as needed.   [facility] authorization has been received today. Auth # [id number]. Authorization will expire on 07/14/15 if patient does not discharge from [facility] by then.   Blima Rich, LCSW [phone]

## 2015-07-12 NOTE — Progress Notes (Signed)
  Subjective:  Postoperative day #4 status post right total knee arthroplasty. Patient has minimal pain in her right knee. Her husband is at the bedside. The patient states she is doing well and is receiving physical and occupational therapy this morning. She has no complaints today. She continues to use her incentive spirometry. Patient had significant improvement in her hemoglobin today after a single unit of PRBCs yesterday.  Objective:   VITALS:   Filed Vitals:   07/11/15 1518 07/11/15 1952 07/12/15 0324 07/12/15 0718  BP: 122/61 110/55 136/57 116/42  Pulse: 87 81 84 86  Temp: 99.6 F (37.6 C) 97.9 F (36.6 C) 98.4 F (36.9 C) 98.2 F (36.8 C)  TempSrc: Oral Oral Oral Oral  Resp: 12 18 18 18   Height:      Weight:      SpO2: 94% 95% 92% 95%    PHYSICAL EXAM:   Right lower extremity: Patient's dressing is clean dry and intact. Patient has a [place] in place and is in a knee immobilizer. The knee immobilizer was opened to examine her dressing. Her leg compartments are soft and compressible and she has no calf tenderness. She has palpable pedal pulses, intact sensation light touch and had active flexion and extension of the toes as well as dorsiflex and plantar flexion of her ankle.   LABS  Results for orders placed or performed during the hospital encounter of 07/08/15 (from the past 24 hour(s))  CBC     Status: Abnormal   Collection Time: 07/12/15  4:15 AM  Result Value Ref Range   WBC 7.3 3.6 - 11.0 K/uL   RBC 3.03 (L) 3.80 - 5.20 MIL/uL   Hemoglobin 9.0 (L) 12.0 - 16.0 g/dL   HCT 26.4 (L) 35.0 - 47.0 %   MCV 87.3 80.0 - 100.0 fL   MCH 29.8 26.0 - 34.0 pg   MCHC 34.1 32.0 - 36.0 g/dL   RDW 13.1 11.5 - 14.5 %   Platelets 186 150 - 440 K/uL    No results found.  Assessment/Plan: 4 Days Post-Op   Active Problems:   History of total knee arthroplasty  Patient doing well postop. She had excellent clinical progress. She is being discharged to [facility]. She will  follow up with me in 10-14 days for wound check, staple removal and x-ray. I answered all questions by the patient and her husband.    Thornton Park , MD 07/12/2015, 12:17 PM

## 2015-07-12 NOTE — Care Management Note (Signed)
Case Management Note  Patient Details  Name: Sara Greene MRN: [id number] Date of Birth: 1940-12-14  Subjective/Objective:       Discharge to [facility] today per CSW note.              Action/Plan:   Expected Discharge Date:                  Expected Discharge Plan:     In-House Referral:     Discharge planning Services  CM Consult  Post Acute Care Choice:  Home Health Choice offered to:  Patient, Adult Children  DME Arranged:    DME Agency:     HH Arranged:    [place] Agency:     Status of Service:  In process, will continue to follow  Medicare Important Message Given:    Date Medicare IM Given:    Medicare IM give by:    Date Additional Medicare IM Given:    Additional Medicare Important Message give by:     If discussed at [place] of Stay Meetings, dates discussed:    Additional Comments:  Jaylise Peek A, RN 07/12/2015, 7:40 AM

## 2015-07-12 NOTE — Progress Notes (Addendum)
Occupational Therapy Treatment Patient Details Name: Sara Greene MRN: [id number] DOB: [birth year] Today's Date: 07/12/2015    History of present illness Pt is a 75 y.o. F who received R TKA on 4/20. Pt has hx of HTN, osteoporosis, depression, and A-fib. Pt very active.   OT comments  Pt. Is a 75 y.o. female who was admitted to [facility] for a Right TKR. Pt. Is PWB on the RLE. Pt. Has to wear the knee immobilizer when up. Pt. Is making excellent progress requiring minA with LE ADL tasks using A/E with knee immobilizer and  requires min guard for transfers. Pt. Plans to go to STR at SNF level of care.   Follow Up Recommendations  SNF    Equipment Recommendations       Recommendations for Other Services PT consult    Precautions / Restrictions         Mobility Bed Mobility Overal bed mobility: Independent Bed Mobility: Supine to Sit     Supine to sit: Independent        Transfers     Transfers: Sit to/from Stand Sit to Stand: Min guard              Balance     Sitting balance-Leahy Scale: Good       Standing balance-Leahy Scale: Fair                     ADL Overall ADL's : Needs assistance/impaired Eating/Feeding: Independent   Grooming: Independent               Lower Body Dressing: Min guard                 General ADL Comments: Min guard for ADL transfers with knee immobilizer      Vision                     Perception     Praxis      Cognition                             Extremity/Trunk Assessment               Exercises     Shoulder Instructions       General Comments      Pertinent Vitals/ Pain          Home Living                                          Prior Functioning/Environment              Frequency Min 2X/week     Progress Toward Goals  OT Goals(current goals can now be found in the care plan section)     Acute Rehab OT Goals Patient Stated  Goal: To be independent with ADLs. OT Goal Formulation: With patient/family Time For Goal Achievement: 07/23/15 Potential to Achieve Goals: Good  Plan Discharge plan remains appropriate    Co-evaluation       Pain: 9/10 in Right Knee.          End of Session Equipment Utilized During Treatment: Gait belt;Right knee immobilizer   Activity Tolerance Patient tolerated treatment well   Patient Left in chair;with call bell/phone within reach;with chair alarm set   Nurse Communication  Time: [id number] OT Time Calculation (min): 30 min  Charges: OT Treatments $Self Care/Home Management : 23-37 mins   Harrel Carina, MS, OTR/L   Harrel Carina 07/12/2015, 10:02 AM

## 2015-07-12 NOTE — Care Management Important Message (Signed)
Important Message  Patient Details  Name: Sara Greene MRN: [id number] Date of Birth: 04/16/1940   Medicare Important Message Given:  Yes    Juliann Pulse A Orion Vandervort 07/12/2015, 10:07 AM

## 2015-07-12 NOTE — Care Management (Signed)
Lovenox cancelled at [facility]. Patient going to SNF today. No further RNCM needs.

## 2015-07-15 DIAGNOSIS — E871 Hypo-osmolality and hyponatremia: Secondary | ICD-10-CM | POA: Diagnosis present

## 2015-07-15 DIAGNOSIS — D649 Anemia, unspecified: Secondary | ICD-10-CM | POA: Diagnosis not present

## 2015-07-15 LAB — COMPREHENSIVE METABOLIC PANEL
ALBUMIN: 2.8 g/dL — AB (ref 3.5–5.0)
ALK PHOS: 80 U/L (ref 38–126)
ALT: 21 U/L (ref 14–54)
ANION GAP: 9 (ref 5–15)
AST: 39 U/L (ref 15–41)
BUN: 9 mg/dL (ref 6–20)
CALCIUM: 8.4 mg/dL — AB (ref 8.9–10.3)
CO2: 28 mmol/L (ref 22–32)
CREATININE: 0.79 mg/dL (ref 0.44–1.00)
Chloride: 97 mmol/L — ABNORMAL LOW (ref 101–111)
GFR calc Af Amer: >60 mL/min (ref >60)
GFR calc non Af Amer: >60 mL/min (ref >60)
GLUCOSE: 97 mg/dL (ref 65–99)
Potassium: 3.9 mmol/L (ref 3.5–5.1)
Sodium: 134 mmol/L — ABNORMAL LOW (ref 135–145)
Total Bilirubin: 0.7 mg/dL (ref 0.3–1.2)
Total Protein: 6.1 g/dL — ABNORMAL LOW (ref 6.5–8.1)

## 2015-07-15 LAB — CBC WITH DIFFERENTIAL/PLATELET
BASOS ABS: 0 10*3/uL (ref 0–0.1)
BASOS PCT: 0 %
EOS PCT: 4 %
Eosinophils Absolute: 0.2 10*3/uL (ref 0–0.7)
HEMATOCRIT: 24.5 % — AB (ref 35.0–47.0)
Hemoglobin: 8.4 g/dL — ABNORMAL LOW (ref 12.0–16.0)
Lymphocytes Relative: 22 %
Lymphs Abs: 1.4 10*3/uL (ref 1.0–3.6)
MCH: 29.5 pg (ref 26.0–34.0)
MCHC: 34.1 g/dL (ref 32.0–36.0)
MCV: 86.6 fL (ref 80.0–100.0)
MONO ABS: 0.7 10*3/uL (ref 0.2–0.9)
MONOS PCT: 11 %
Neutro Abs: 3.9 10*3/uL (ref 1.4–6.5)
Neutrophils Relative %: 63 %
PLATELETS: 295 10*3/uL (ref 150–440)
RBC: 2.84 MIL/uL — ABNORMAL LOW (ref 3.80–5.20)
RDW: 13.3 % (ref 11.5–14.5)
WBC: 6.2 10*3/uL (ref 3.6–11.0)

## 2015-07-19 ENCOUNTER — Encounter
Admission: RE | Admit: 2015-07-19 | Discharge: 2015-07-19 | Disposition: A | Discharge status: Home / Self Care | Payer: Medicare Other | Source: Ambulatory Visit | Attending: Internal Medicine | Admitting: Internal Medicine

## 2015-07-19 DIAGNOSIS — E871 Hypo-osmolality and hyponatremia: Secondary | ICD-10-CM | POA: Insufficient documentation

## 2015-07-19 DIAGNOSIS — D649 Anemia, unspecified: Secondary | ICD-10-CM | POA: Insufficient documentation

## 2015-07-20 DIAGNOSIS — E871 Hypo-osmolality and hyponatremia: Secondary | ICD-10-CM | POA: Diagnosis present

## 2015-07-20 DIAGNOSIS — D649 Anemia, unspecified: Secondary | ICD-10-CM | POA: Diagnosis not present

## 2015-07-20 LAB — COMPREHENSIVE METABOLIC PANEL
ALBUMIN: 3.3 g/dL — AB (ref 3.5–5.0)
ALT: 21 U/L (ref 14–54)
ANION GAP: 9 (ref 5–15)
AST: 29 U/L (ref 15–41)
Alkaline Phosphatase: 71 U/L (ref 38–126)
BILIRUBIN TOTAL: 0.7 mg/dL (ref 0.3–1.2)
BUN: 10 mg/dL (ref 6–20)
CALCIUM: 9 mg/dL (ref 8.9–10.3)
CO2: 28 mmol/L (ref 22–32)
Chloride: 98 mmol/L — ABNORMAL LOW (ref 101–111)
Creatinine, Ser: 0.77 mg/dL (ref 0.44–1.00)
Glucose, Bld: 95 mg/dL (ref 65–99)
POTASSIUM: 4.2 mmol/L (ref 3.5–5.1)
Sodium: 135 mmol/L (ref 135–145)
TOTAL PROTEIN: 6.7 g/dL (ref 6.5–8.1)

## 2015-07-20 LAB — CBC WITH DIFFERENTIAL/PLATELET
BASOS ABS: 0 10*3/uL (ref 0–0.1)
Basophils Relative: 0 %
Eosinophils Absolute: 0.2 10*3/uL (ref 0–0.7)
Eosinophils Relative: 2 %
HEMATOCRIT: 27.9 % — AB (ref 35.0–47.0)
Hemoglobin: 9.2 g/dL — ABNORMAL LOW (ref 12.0–16.0)
LYMPHS ABS: 1.7 10*3/uL (ref 1.0–3.6)
Lymphocytes Relative: 20 %
MCH: 28.3 pg (ref 26.0–34.0)
MCHC: 32.9 g/dL (ref 32.0–36.0)
MCV: 85.9 fL (ref 80.0–100.0)
MONOS PCT: 8 %
Monocytes Absolute: 0.7 10*3/uL (ref 0.2–0.9)
NEUTROS ABS: 5.8 10*3/uL (ref 1.4–6.5)
Neutrophils Relative %: 70 %
Platelets: 507 10*3/uL — ABNORMAL HIGH (ref 150–440)
RBC: 3.25 MIL/uL — ABNORMAL LOW (ref 3.80–5.20)
RDW: 13.6 % (ref 11.5–14.5)
WBC: 8.4 10*3/uL (ref 3.6–11.0)

## 2015-08-04 ENCOUNTER — Other Ambulatory Visit: Payer: Self-pay | Admitting: Family Medicine

## 2015-08-04 DIAGNOSIS — Z1231 Encounter for screening mammogram for malignant neoplasm of breast: Secondary | ICD-10-CM

## 2015-08-19 ENCOUNTER — Ambulatory Visit
Admission: RE | Admit: 2015-08-19 | Discharge: 2015-08-19 | Disposition: A | Discharge status: Home / Self Care | Payer: Medicare Other | Source: Ambulatory Visit | Attending: Family Medicine | Admitting: Family Medicine

## 2015-08-19 ENCOUNTER — Other Ambulatory Visit: Payer: Self-pay | Admitting: Family Medicine

## 2015-08-19 DIAGNOSIS — Z1231 Encounter for screening mammogram for malignant neoplasm of breast: Secondary | ICD-10-CM

## 2015-08-23 ENCOUNTER — Other Ambulatory Visit: Payer: Self-pay | Admitting: Family Medicine

## 2015-08-23 DIAGNOSIS — R928 Other abnormal and inconclusive findings on diagnostic imaging of breast: Secondary | ICD-10-CM

## 2015-09-02 ENCOUNTER — Ambulatory Visit
Admission: RE | Admit: 2015-09-02 | Discharge: 2015-09-02 | Disposition: A | Discharge status: Home / Self Care | Payer: Medicare Other | Source: Ambulatory Visit | Attending: Family Medicine | Admitting: Family Medicine

## 2015-09-02 DIAGNOSIS — R928 Other abnormal and inconclusive findings on diagnostic imaging of breast: Secondary | ICD-10-CM

## 2015-09-02 DIAGNOSIS — N63 Unspecified lump in breast: Secondary | ICD-10-CM | POA: Insufficient documentation

## 2015-09-07 ENCOUNTER — Other Ambulatory Visit: Payer: Self-pay | Admitting: Family Medicine

## 2015-09-07 DIAGNOSIS — N6320 Unspecified lump in the left breast, unspecified quadrant: Secondary | ICD-10-CM

## 2015-09-20 ENCOUNTER — Ambulatory Visit
Admission: RE | Admit: 2015-09-20 | Discharge: 2015-09-20 | Disposition: A | Discharge status: Home / Self Care | Payer: Medicare Other | Source: Ambulatory Visit | Attending: Family Medicine | Admitting: Family Medicine

## 2015-09-20 DIAGNOSIS — C50412 Malignant neoplasm of upper-outer quadrant of left female breast: Secondary | ICD-10-CM | POA: Insufficient documentation

## 2015-09-20 DIAGNOSIS — N6320 Unspecified lump in the left breast, unspecified quadrant: Secondary | ICD-10-CM

## 2015-09-20 DIAGNOSIS — N63 Unspecified lump in breast: Secondary | ICD-10-CM | POA: Diagnosis present

## 2015-09-20 DIAGNOSIS — D0592 Unspecified type of carcinoma in situ of left breast: Secondary | ICD-10-CM | POA: Diagnosis not present

## 2015-09-20 HISTORY — PX: BREAST BIOPSY: SHX20

## 2015-09-23 LAB — SURGICAL PATHOLOGY

## 2015-09-24 NOTE — Progress Notes (Signed)
  Oncology Nurse Navigator Documentation  Navigator Location: [facility]Med Onc (09/24/15 1000)     Abnormal Finding Date: 09/02/15 (09/24/15 1000) Confirmed Diagnosis Date: 09/20/15 (09/24/15 1000)     Patient Visit Type: Initial (09/24/15 1000)                    Acuity: Level 1 (09/24/15 1000)         Time Spent with Patient: 30 (09/24/15 1000)   Received pathology.  Per physician preference sheet, patient to be scheduled for Med ONC/Surgery consult.  Phoned patient to introduce Navigation service.  Patient states she is trying to schedule appointment at [facility], and Dr. Netty Starring is aware.  Encouraged to call if she has any questions, or any needs here.
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
## 2015-12-09 ENCOUNTER — Emergency Department
Admission: EM | Admit: 2015-12-09 | Discharge: 2015-12-09 | Disposition: A | Discharge status: Home / Self Care | Payer: Medicare Other | Attending: Emergency Medicine | Admitting: Emergency Medicine

## 2015-12-09 DIAGNOSIS — T402X1A Poisoning by other opioids, accidental (unintentional), initial encounter: Secondary | ICD-10-CM | POA: Insufficient documentation

## 2015-12-09 DIAGNOSIS — Z79899 Other long term (current) drug therapy: Secondary | ICD-10-CM | POA: Diagnosis not present

## 2015-12-09 DIAGNOSIS — I10 Essential (primary) hypertension: Secondary | ICD-10-CM | POA: Insufficient documentation

## 2015-12-09 DIAGNOSIS — E039 Hypothyroidism, unspecified: Secondary | ICD-10-CM | POA: Diagnosis not present

## 2015-12-09 DIAGNOSIS — T50901A Poisoning by unspecified drugs, medicaments and biological substances, accidental (unintentional), initial encounter: Secondary | ICD-10-CM

## 2015-12-09 DIAGNOSIS — Z7982 Long term (current) use of aspirin: Secondary | ICD-10-CM | POA: Insufficient documentation

## 2015-12-09 NOTE — ED Notes (Signed)
Pt discharged to home.  Family member driving.  Discharge instructions reviewed.  Verbalized understanding.  No questions or concerns at this time.  Teach back verified.  Pt in NAD.  No items left in ED.   

## 2015-12-09 NOTE — ED Provider Notes (Signed)
Time Seen: Approximately 2234  I have reviewed the triage notes  Chief Complaint: Drug Overdose   History of Present Illness: Sara Greene is a 75 y.o. female *who presents with accidental overdose. Patient states she took 2 of her morphine 30 mg tablets tonight when she normally only takes 1 tablet. She tried to make herself vomit without effect. She states otherwise she feels fine. She is upset that she overdosed on the medication by accident. Patient states she is normally very vigilant about her medications. She has a chronic pain disorder. Patient denies any suicidal thoughts, homicidal thoughts or hallucinations   Past Medical History:  Diagnosis Date  . Arthritis   . Atrial fibrillation ([place])   . Chronic pain   . Chronic urethral narrowing    please use the smallest foley catheter available for her  . Depression   . Dysrhythmia    a fib. per patient, dr. lithicum could not confirm this diagnosis  . GERD (gastroesophageal reflux disease)   . Hyperlipidemia   . Hypertension   . Hypokalemia   . Hyponatremia   . Hypothyroidism   . Osteoporosis     Patient Active Problem List   Diagnosis Date Noted  . 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 APPENDECTOMY    . BACK SURGERY  1977   no metal in back, laminectomy  . BREAST BIOPSY Left 09/20/2015   PATH PENDING  . BREAST CYST EXCISION Left yrs ago   benign  . CHOLECYSTECTOMY    . COLONOSCOPY    . COLONOSCOPY WITH PROPOFOL N/A 10/15/2014   Procedure: COLONOSCOPY WITH PROPOFOL;  Surgeon: Hulen Luster, MD;  Location: [facility] ENDOSCOPY;  Service: Gastroenterology;  Laterality: N/A;  . DENTAL SURGERY  2016   dental implants to hook dentures on  . FOOT SURGERY Right 2002   screws in right foot  . fracture Rt arm Right 2002   no metal  . JOINT REPLACEMENT    . ROTATOR CUFF REPAIR Left 1990  . TENDON REPAIR Bilateral   . TOTAL KNEE ARTHROPLASTY Right 07/08/2015    Procedure: TOTAL KNEE ARTHROPLASTY;  Surgeon: Thornton Park, MD;  Location: [facility] ORS;  Service: Orthopedics;  Laterality: Right;    Past Surgical History:  Procedure Laterality Date  . APPENDECTOMY    . BACK SURGERY  1977   no metal in back, laminectomy  . BREAST BIOPSY Left 09/20/2015   PATH PENDING  . BREAST CYST EXCISION Left yrs ago   benign  . CHOLECYSTECTOMY    . COLONOSCOPY    . COLONOSCOPY WITH PROPOFOL N/A 10/15/2014   Procedure: COLONOSCOPY WITH PROPOFOL;  Surgeon: Hulen Luster, MD;  Location: [facility] ENDOSCOPY;  Service: Gastroenterology;  Laterality: N/A;  . DENTAL SURGERY  2016   dental implants to hook dentures on  . FOOT SURGERY Right 2002   screws in right foot  . fracture Rt arm Right 2002   no metal  . JOINT REPLACEMENT    . ROTATOR CUFF REPAIR Left 1990  . TENDON REPAIR Bilateral   . TOTAL KNEE ARTHROPLASTY Right 07/08/2015   Procedure: TOTAL KNEE ARTHROPLASTY;  Surgeon: Thornton Park, MD;  Location: [facility] ORS;  Service: Orthopedics;  Laterality: Right;    Current Outpatient Rx  . Order #: [id number] Class: Normal  . Order #: [id number] Class: Historical Med  . Order #: [id number] Class: Historical Med  . Order #: [id number] Class: Historical Med  . Order #: [id number] Class: Historical  Med  . Order #: [id number] Class: Historical Med  . Order #: [id number] Class: Historical Med  . Order #: [id number] Class: Historical Med  . Order #: [id number] Class: Print  . Order #: [id number] Class: Historical Med  . Order #: [id number] Class: Historical Med  . Order #: [id number] Class: Historical Med  . Order #: [id number] Class: Print  . Order #: [id number] Class: Historical Med  . Order #: [id number] Class: Historical Med  . Order #: [id number] Class: Historical Med    Allergies:  Lipitor [atorvastatin] and Shellfish allergy  Family History: No family history on file.  Social History: Social History  Substance Use Topics  . Smoking status: Never Smoker  . Smokeless tobacco: Not on file   . Alcohol use No     Review of Systems:   10 point review of systems was performed and was otherwise negative:  Constitutional: No fever Eyes: No visual disturbances ENT: No sore throat, ear pain Cardiac: No chest pain Respiratory: No shortness of breath, wheezing, or stridor Abdomen: No abdominal pain, no vomiting, No diarrhea Endocrine: No weight loss, No night sweats Extremities: No peripheral edema, cyanosis Skin: No rashes, easy bruising Neurologic: No focal weakness, trouble with speech or swollowing Urologic: No dysuria, Hematuria, or urinary frequency   Physical Exam:  ED Triage Vitals [12/09/15 2146]  Enc Vitals Group     BP (!) 149/92     Pulse Rate (!) 101     Resp 20     Temp 98.6 F (37 C)     Temp Source Oral     SpO2 97 %     Weight 192 lb (87.1 kg)     Height 5\' 5"  (1.651 m)     Head Circumference      Peak Flow      Pain Score 0     Pain Loc      Pain Edu?      Excl. in [place]?     General: Awake , Alert , and Oriented times 3; GCS 15 Head: Normal cephalic , atraumatic Eyes: Pupils equal , round, reactive to light Nose/Throat: No nasal drainage, patent upper airway without erythema or exudate.  Neck: Supple, Full range of motion, No anterior adenopathy or palpable thyroid masses Lungs: Clear to ascultation without wheezes , rhonchi, or rales Heart: Regular rate, regular rhythm without murmurs , gallops , or rubs Abdomen: Soft, non tender without rebound, guarding , or rigidity; bowel sounds positive and symmetric in all 4 quadrants. No organomegaly .        Extremities: 2 plus symmetric pulses. No edema, clubbing or cyanosis Neurologic: normal ambulation, Motor symmetric without deficits, sensory intact Skin: warm, dry, no rashes    ED Course:  Patient was observed for approximately an hour and a half without any adverse effects from the extra pain medication that she took. No drowsiness, no nausea or vomiting. Patient states that she wishes to  go home at this time. Clinical Course     Assessment:  Accidental overdose Final Clinical Impression:   Final diagnoses:  Drug overdose, accidental or unintentional, initial encounter     Plan:  Outpatient Patient was advised to return immediately if condition worsens. Patient was advised to follow up with their primary care physician or other specialized physicians involved in their outpatient care. The patient and/or family member/power of attorney had laboratory results reviewed at the bedside. All questions and concerns were addressed and appropriate discharge instructions were distributed by the nursing staff.  Daymon Larsen, MD 12/09/15 2322

## 2015-12-09 NOTE — Discharge Instructions (Signed)
Please return immediately if condition worsens. Please contact her primary physician or the physician you were given for referral. If you have any specialist physicians involved in her treatment and plan please also contact them. Thank you for using [facility]. ° °

## 2015-12-09 NOTE — ED Triage Notes (Signed)
Pt to triage via w/c, tearful, no distress noted; pt reports inadvertently taking 2-30mg  morphine and 1 perocet 10mg  34minutes PTA; st her last ds was this morning; st attempted to induce vomiting without success; spoke with Dr Marcelene Butte who st no protocols, will just observe

## 2015-12-09 NOTE — ED Notes (Signed)
Pt reports taking 2 of her 30mg  ER morphine pills tonight instead of 1.  Pt is tearful and anxious at this time, but no distress noted.  Pt has husband and daughter at bedside.  No other needs at this time.

## 2016-03-08 ENCOUNTER — Other Ambulatory Visit: Payer: Self-pay | Admitting: Rehabilitation

## 2016-03-08 DIAGNOSIS — M25811 Other specified joint disorders, right shoulder: Secondary | ICD-10-CM

## 2016-03-10 ENCOUNTER — Ambulatory Visit
Admission: RE | Admit: 2016-03-10 | Discharge: 2016-03-10 | Disposition: A | Discharge status: Home / Self Care | Payer: Medicare Other | Source: Ambulatory Visit | Attending: Rehabilitation | Admitting: Rehabilitation

## 2016-03-10 DIAGNOSIS — M25811 Other specified joint disorders, right shoulder: Secondary | ICD-10-CM | POA: Insufficient documentation

## 2016-12-18 ENCOUNTER — Emergency Department: Payer: Medicare Other

## 2016-12-18 ENCOUNTER — Inpatient Hospital Stay
Admission: EM | Admit: 2016-12-18 | Discharge: 2016-12-22 | DRG: 481 | Disposition: A | Discharge status: Skilled Nursing Facility | Payer: Medicare Other | Attending: Internal Medicine | Admitting: Internal Medicine

## 2016-12-18 ENCOUNTER — Encounter: Payer: Self-pay | Admitting: Emergency Medicine

## 2016-12-18 ENCOUNTER — Inpatient Hospital Stay: Payer: Medicare Other

## 2016-12-18 DIAGNOSIS — F329 Major depressive disorder, single episode, unspecified: Secondary | ICD-10-CM | POA: Diagnosis present

## 2016-12-18 DIAGNOSIS — D62 Acute posthemorrhagic anemia: Secondary | ICD-10-CM | POA: Diagnosis not present

## 2016-12-18 DIAGNOSIS — G8929 Other chronic pain: Secondary | ICD-10-CM | POA: Diagnosis present

## 2016-12-18 DIAGNOSIS — Y92000 Kitchen of unspecified non-institutional (private) residence as  the place of occurrence of the external cause: Secondary | ICD-10-CM | POA: Diagnosis not present

## 2016-12-18 DIAGNOSIS — W1830XA Fall on same level, unspecified, initial encounter: Secondary | ICD-10-CM | POA: Diagnosis present

## 2016-12-18 DIAGNOSIS — S72001A Fracture of unspecified part of neck of right femur, initial encounter for closed fracture: Secondary | ICD-10-CM | POA: Diagnosis present

## 2016-12-18 DIAGNOSIS — Z96651 Presence of right artificial knee joint: Secondary | ICD-10-CM | POA: Diagnosis present

## 2016-12-18 DIAGNOSIS — S52501A Unspecified fracture of the lower end of right radius, initial encounter for closed fracture: Secondary | ICD-10-CM | POA: Diagnosis present

## 2016-12-18 DIAGNOSIS — S72141A Displaced intertrochanteric fracture of right femur, initial encounter for closed fracture: Principal | ICD-10-CM | POA: Diagnosis present

## 2016-12-18 DIAGNOSIS — K219 Gastro-esophageal reflux disease without esophagitis: Secondary | ICD-10-CM | POA: Diagnosis present

## 2016-12-18 DIAGNOSIS — E876 Hypokalemia: Secondary | ICD-10-CM | POA: Diagnosis present

## 2016-12-18 DIAGNOSIS — Z91013 Allergy to seafood: Secondary | ICD-10-CM | POA: Diagnosis not present

## 2016-12-18 DIAGNOSIS — M1990 Unspecified osteoarthritis, unspecified site: Secondary | ICD-10-CM | POA: Diagnosis present

## 2016-12-18 DIAGNOSIS — E860 Dehydration: Secondary | ICD-10-CM | POA: Diagnosis present

## 2016-12-18 DIAGNOSIS — Z96659 Presence of unspecified artificial knee joint: Secondary | ICD-10-CM

## 2016-12-18 DIAGNOSIS — Z79899 Other long term (current) drug therapy: Secondary | ICD-10-CM

## 2016-12-18 DIAGNOSIS — Z888 Allergy status to other drugs, medicaments and biological substances status: Secondary | ICD-10-CM | POA: Diagnosis not present

## 2016-12-18 DIAGNOSIS — I10 Essential (primary) hypertension: Secondary | ICD-10-CM | POA: Diagnosis present

## 2016-12-18 DIAGNOSIS — M810 Age-related osteoporosis without current pathological fracture: Secondary | ICD-10-CM | POA: Diagnosis present

## 2016-12-18 DIAGNOSIS — S0083XA Contusion of other part of head, initial encounter: Secondary | ICD-10-CM | POA: Diagnosis present

## 2016-12-18 DIAGNOSIS — E785 Hyperlipidemia, unspecified: Secondary | ICD-10-CM | POA: Diagnosis present

## 2016-12-18 DIAGNOSIS — S62101A Fracture of unspecified carpal bone, right wrist, initial encounter for closed fracture: Secondary | ICD-10-CM

## 2016-12-18 DIAGNOSIS — Z79891 Long term (current) use of opiate analgesic: Secondary | ICD-10-CM

## 2016-12-18 DIAGNOSIS — E039 Hypothyroidism, unspecified: Secondary | ICD-10-CM | POA: Diagnosis present

## 2016-12-18 DIAGNOSIS — Z7982 Long term (current) use of aspirin: Secondary | ICD-10-CM

## 2016-12-18 DIAGNOSIS — S72009A Fracture of unspecified part of neck of unspecified femur, initial encounter for closed fracture: Secondary | ICD-10-CM

## 2016-12-18 DIAGNOSIS — I482 Chronic atrial fibrillation: Secondary | ICD-10-CM | POA: Diagnosis present

## 2016-12-18 DIAGNOSIS — E871 Hypo-osmolality and hyponatremia: Secondary | ICD-10-CM | POA: Diagnosis present

## 2016-12-18 LAB — BASIC METABOLIC PANEL
Anion gap: 10 (ref 5–15)
BUN: 9 mg/dL (ref 6–20)
CALCIUM: 9.1 mg/dL (ref 8.9–10.3)
CHLORIDE: 96 mmol/L — AB (ref 101–111)
CO2: 27 mmol/L (ref 22–32)
CREATININE: 1.06 mg/dL — AB (ref 0.44–1.00)
GFR calc Af Amer: 57 mL/min — ABNORMAL LOW (ref >60)
GFR calc non Af Amer: 49 mL/min — ABNORMAL LOW (ref >60)
GLUCOSE: 110 mg/dL — AB (ref 65–99)
Potassium: 4.2 mmol/L (ref 3.5–5.1)
Sodium: 133 mmol/L — ABNORMAL LOW (ref 135–145)

## 2016-12-18 LAB — CBC
HCT: 35.8 % (ref 35.0–47.0)
HEMOGLOBIN: 12.4 g/dL (ref 12.0–16.0)
MCH: 30.4 pg (ref 26.0–34.0)
MCHC: 34.7 g/dL (ref 32.0–36.0)
MCV: 87.5 fL (ref 80.0–100.0)
PLATELETS: 221 10*3/uL (ref 150–440)
RBC: 4.09 MIL/uL (ref 3.80–5.20)
RDW: 13.5 % (ref 11.5–14.5)
WBC: 5.5 10*3/uL (ref 3.6–11.0)

## 2016-12-18 LAB — SURGICAL PCR SCREEN
MRSA, PCR: NEGATIVE
STAPHYLOCOCCUS AUREUS: NEGATIVE

## 2016-12-18 MED ORDER — MORPHINE SULFATE ER 30 MG PO TBCR
30.0000 mg | EXTENDED_RELEASE_TABLET | Freq: Two times a day (BID) | ORAL | Status: DC
  Administered 2016-12-18 – 2016-12-22 (×7): 30 mg via ORAL
  Filled 2016-12-18 (×7): qty 1

## 2016-12-18 MED ORDER — KETOROLAC TROMETHAMINE 15 MG/ML IJ SOLN
15.0000 mg | Freq: Four times a day (QID) | INTRAMUSCULAR | Status: DC | PRN
  Administered 2016-12-18 – 2016-12-19 (×4): 15 mg via INTRAVENOUS
  Filled 2016-12-18 (×4): qty 1

## 2016-12-18 MED ORDER — PRAVASTATIN SODIUM 20 MG PO TABS
40.0000 mg | ORAL_TABLET | Freq: Every day | ORAL | Status: DC
  Administered 2016-12-18 – 2016-12-21 (×3): 40 mg via ORAL
  Filled 2016-12-18 (×3): qty 2

## 2016-12-18 MED ORDER — FENTANYL CITRATE (PF) 100 MCG/2ML IJ SOLN
100.0000 ug | Freq: Once | INTRAMUSCULAR | Status: AC
  Administered 2016-12-18: 100 ug via INTRAVENOUS
  Filled 2016-12-18: qty 2

## 2016-12-18 MED ORDER — ZOLPIDEM TARTRATE 5 MG PO TABS
5.0000 mg | ORAL_TABLET | Freq: Every evening | ORAL | Status: DC | PRN
  Administered 2016-12-21: 5 mg via ORAL
  Filled 2016-12-18: qty 1

## 2016-12-18 MED ORDER — ACETAMINOPHEN 650 MG RE SUPP: 650.0000 mg | Freq: Four times a day (QID) | RECTAL | Status: DC | PRN

## 2016-12-18 MED ORDER — OXYBUTYNIN CHLORIDE 5 MG PO TABS
5.0000 mg | ORAL_TABLET | Freq: Two times a day (BID) | ORAL | Status: DC
  Administered 2016-12-18 – 2016-12-22 (×6): 5 mg via ORAL
  Filled 2016-12-18 (×9): qty 1

## 2016-12-18 MED ORDER — PANTOPRAZOLE SODIUM 40 MG PO TBEC
40.0000 mg | DELAYED_RELEASE_TABLET | Freq: Every day | ORAL | Status: DC
  Administered 2016-12-18 – 2016-12-22 (×4): 40 mg via ORAL
  Filled 2016-12-18 (×4): qty 1

## 2016-12-18 MED ORDER — GABAPENTIN 300 MG PO CAPS
600.0000 mg | ORAL_CAPSULE | ORAL | Status: DC
  Administered 2016-12-20 – 2016-12-22 (×3): 600 mg via ORAL
  Filled 2016-12-18 (×3): qty 2

## 2016-12-18 MED ORDER — SENNOSIDES-DOCUSATE SODIUM 8.6-50 MG PO TABS
1.0000 | ORAL_TABLET | Freq: Every evening | ORAL | Status: DC | PRN
  Administered 2016-12-20: 1 via ORAL
  Filled 2016-12-18: qty 1

## 2016-12-18 MED ORDER — ONDANSETRON HCL 4 MG PO TABS
4.0000 mg | ORAL_TABLET | Freq: Four times a day (QID) | ORAL | Status: DC | PRN
  Filled 2016-12-18 (×2): qty 1

## 2016-12-18 MED ORDER — POTASSIUM CHLORIDE CRYS ER 10 MEQ PO TBCR
10.0000 meq | EXTENDED_RELEASE_TABLET | Freq: Every day | ORAL | Status: DC
  Administered 2016-12-18 – 2016-12-22 (×4): 10 meq via ORAL
  Filled 2016-12-18 (×4): qty 1

## 2016-12-18 MED ORDER — PAROXETINE HCL 20 MG PO TABS
30.0000 mg | ORAL_TABLET | ORAL | Status: DC
  Administered 2016-12-20 – 2016-12-22 (×3): 30 mg via ORAL
  Filled 2016-12-18 (×3): qty 2

## 2016-12-18 MED ORDER — DOCUSATE SODIUM 100 MG PO CAPS
100.0000 mg | ORAL_CAPSULE | Freq: Two times a day (BID) | ORAL | Status: DC
  Administered 2016-12-18 – 2016-12-22 (×6): 100 mg via ORAL
  Filled 2016-12-18 (×6): qty 1

## 2016-12-18 MED ORDER — OXYCODONE HCL 5 MG PO TABS
10.0000 mg | ORAL_TABLET | Freq: Four times a day (QID) | ORAL | Status: DC | PRN
  Administered 2016-12-18 – 2016-12-19 (×4): 10 mg via ORAL
  Filled 2016-12-18 (×4): qty 2

## 2016-12-18 MED ORDER — LEVOTHYROXINE SODIUM 100 MCG PO TABS
100.0000 ug | ORAL_TABLET | Freq: Every day | ORAL | Status: DC
  Administered 2016-12-19 – 2016-12-22 (×4): 100 ug via ORAL
  Filled 2016-12-18 (×4): qty 1

## 2016-12-18 MED ORDER — ONDANSETRON HCL 4 MG/2ML IJ SOLN
4.0000 mg | Freq: Four times a day (QID) | INTRAMUSCULAR | Status: DC | PRN
  Administered 2016-12-20: 4 mg via INTRAVENOUS

## 2016-12-18 MED ORDER — ONDANSETRON HCL 4 MG/2ML IJ SOLN
4.0000 mg | Freq: Once | INTRAMUSCULAR | Status: AC
Start: 2016-12-18 — End: 2016-12-18
  Administered 2016-12-18: 4 mg via INTRAVENOUS
  Filled 2016-12-18: qty 2

## 2016-12-18 MED ORDER — ACETAMINOPHEN 325 MG PO TABS
650.0000 mg | ORAL_TABLET | Freq: Four times a day (QID) | ORAL | Status: DC | PRN
  Administered 2016-12-19 – 2016-12-22 (×4): 650 mg via ORAL
  Filled 2016-12-18 (×4): qty 2

## 2016-12-18 MED ORDER — DOXEPIN HCL 50 MG PO CAPS
50.0000 mg | ORAL_CAPSULE | Freq: Every day | ORAL | Status: DC
  Administered 2016-12-18 – 2016-12-21 (×3): 50 mg via ORAL
  Filled 2016-12-18 (×5): qty 1

## 2016-12-18 MED ORDER — LISINOPRIL 5 MG PO TABS
5.0000 mg | ORAL_TABLET | Freq: Every day | ORAL | Status: DC
  Administered 2016-12-18 – 2016-12-19 (×2): 5 mg via ORAL
  Filled 2016-12-18 (×2): qty 1

## 2016-12-18 MED ORDER — BISACODYL 5 MG PO TBEC
5.0000 mg | DELAYED_RELEASE_TABLET | Freq: Every day | ORAL | Status: DC | PRN
  Administered 2016-12-21: 5 mg via ORAL
  Filled 2016-12-18: qty 1

## 2016-12-18 MED ORDER — SODIUM CHLORIDE 0.9 % IV SOLN
INTRAVENOUS | Status: DC
  Administered 2016-12-18 – 2016-12-19 (×2): via INTRAVENOUS

## 2016-12-18 MED ORDER — ALBUTEROL SULFATE (2.5 MG/3ML) 0.083% IN NEBU: 2.5000 mg | INHALATION_SOLUTION | RESPIRATORY_TRACT | Status: DC | PRN

## 2016-12-18 NOTE — ED Notes (Signed)
Hospitalist to bedside at this time 

## 2016-12-18 NOTE — ED Notes (Signed)
Patient transported to CT 

## 2016-12-18 NOTE — ED Triage Notes (Signed)
Pt in via [facility] from home with complaints of right hip pain and right wrist pain after falling in the kitchen.  Pt does not recall mechanism of fall, states she landed on right side, unable to tell me if she hit her head, denies LOC.  Deformity noted to right hip and wrist.  Pt A/Ox4 upon arrival, vitals WDL.

## 2016-12-18 NOTE — ED Provider Notes (Signed)
[facility] Emergency Department Provider Note  Time seen: 7:24 PM  I have reviewed the triage vital signs and the nursing notes.   HISTORY  Chief Complaint Fall and Hip Pain    HPI Sara Greene is a 76 y.o. female With a past medical history of arthritis, atrial fibrillation on 81 mg aspirin, chronic pain, depression, hypertension, hyperlipidemia, presents to the emergency department after a fall. According to the patient she was getting a drink in her kitchen when she fell landing on her right side. Patient states she could not stand after the fall due to right hip pain and also right wrist pain currently states 10 out of 10 sharp pain in her right hip ORIF with any movement, 10 out of 10 pain in her right wrist. Patient also hit her head but denies LOC or vomiting. Has a hematoma to her right forehead.  Past Medical History:  Diagnosis Date  . Arthritis   . Atrial fibrillation ([place])   . Chronic pain   . Chronic urethral narrowing    please use the smallest foley catheter available for her  . Depression   . Dysrhythmia    a fib. per patient, dr. lithicum could not confirm this diagnosis  . GERD (gastroesophageal reflux disease)   . Hyperlipidemia   . Hypertension   . Hypokalemia   . Hyponatremia   . Hypothyroidism   . Osteoporosis     Patient Active Problem List   Diagnosis Date Noted  . 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 APPENDECTOMY    . BACK SURGERY  1977   no metal in back, laminectomy  . BREAST BIOPSY Left 09/20/2015   PATH PENDING  . BREAST CYST EXCISION Left yrs ago   benign  . CHOLECYSTECTOMY    . COLONOSCOPY    . COLONOSCOPY WITH PROPOFOL N/A 10/15/2014   Procedure: COLONOSCOPY WITH PROPOFOL;  Surgeon: Hulen Luster, MD;  Location: [facility] ENDOSCOPY;  Service: Gastroenterology;  Laterality: N/A;  . DENTAL SURGERY  2016   dental implants to hook dentures on  . FOOT SURGERY  Right 2002   screws in right foot  . fracture Rt arm Right 2002   no metal  . JOINT REPLACEMENT    . ROTATOR CUFF REPAIR Left 1990  . TENDON REPAIR Bilateral   . TOTAL KNEE ARTHROPLASTY Right 07/08/2015   Procedure: TOTAL KNEE ARTHROPLASTY;  Surgeon: Thornton Park, MD;  Location: [facility] ORS;  Service: Orthopedics;  Laterality: Right;    Prior to Admission medications   Medication Sig Start Date End Date Taking? Authorizing Provider  aspirin EC 325 MG EC tablet Take 1 tablet (325 mg total) by mouth 2 (two) times daily. 07/12/15   Thornton Park, MD  calcium-vitamin D (OSCAL WITH D) 500-200 MG-UNIT per tablet Take 2 tablets by mouth daily.     [provider]  doxepin (SINEQUAN) 50 MG capsule Take 50 mg by mouth at bedtime.    [provider]  esomeprazole (NEXIUM) 20 MG capsule Take 20 mg by mouth every morning.     [provider]  gabapentin (NEURONTIN) 600 MG tablet Take 600 mg by mouth every morning.     [provider]  levothyroxine (SYNTHROID, LEVOTHROID) 112 MCG tablet Take 112 mcg by mouth daily before breakfast.    [provider]  lisinopril (PRINIVIL,ZESTRIL) 5 MG tablet Take 5 mg by mouth daily.    [provider]  morphine (MS CONTIN) 30 MG 12 hr tablet Take 30 mg by mouth every 12 (twelve) hours.    [provider]  morphine (MS CONTIN) 30 MG 12 hr tablet Take 1 tablet (30 mg total) by mouth every 12 (twelve) hours. 07/09/15   Thornton Park, MD  Multiple Vitamins-Minerals (CENTRUM SILVER ADULT 50+ PO) Take 1 tablet by mouth daily.     [provider]  omeprazole (PRILOSEC) 20 MG capsule Take 20 mg by mouth at bedtime as needed.     [provider]  oxybutynin (DITROPAN) 5 MG tablet Take 5 mg by mouth 2 (two) times daily.     [provider]  Oxycodone HCl 10 MG TABS Take 1 tablet (10 mg total) by mouth every 3 (three) hours as needed. 07/09/15   Thornton Park, MD  PARoxetine (PAXIL)  30 MG tablet Take 30 mg by mouth every morning.     [provider]  potassium chloride (K-DUR,KLOR-CON) 10 MEQ tablet Take 10 mEq by mouth daily.     [provider]  pravastatin (PRAVACHOL) 40 MG tablet Take 40 mg by mouth at bedtime.     [provider]    Allergies  Allergen Reactions  . Lipitor [Atorvastatin] Other (See Comments)    lupus  . Shellfish Allergy Nausea Only    No problems with topical betadine    No family history on file.  Social History Social History  Substance Use Topics  . Smoking status: Never Smoker  . Smokeless tobacco: Never Used  . Alcohol use No    Review of Systems Constitutional: Negative for fever. Cardiovascular: Negative for chest pain. Respiratory: Negative for shortness of breath. Gastrointestinal: Negative for abdominal pain Musculoskeletal: right hip pain, right wrist pain. Skin: Negative for abrasion or laceration Neurological: Negative for headaches, focal weakness or numbness. All other ROS negative  ____________________________________________   PHYSICAL EXAM:  VITAL SIGNS: ED Triage Vitals  Enc Vitals Group     BP 12/18/16 1821 (!) 175/68     Pulse Rate 12/18/16 1821 71     Resp 12/18/16 1821 (!) 8     Temp 12/18/16 1821 98.1 F (36.7 C)     Temp Source 12/18/16 1821 Oral     SpO2 12/18/16 1821 100 %     Weight 12/18/16 1822 186 lb (84.4 kg)     Height 12/18/16 1822 5\' 4"  (1.626 m)     Head Circumference --      Peak Flow --      Pain Score 12/18/16 1819 8     Pain Loc --      Pain Edu? --      Excl. in [place]? --     Constitutional: Alert and oriented. Well appearing and in no distress. Eyes: Normal exam ENT   Head: small right forehead hematoma   Mouth/Throat: Mucous membranes are moist. Cardiovascular: Normal rate, regular rhythm. No murmur Respiratory: Normal respiratory effort without tachypnea nor retractions. Breath sounds are clear  Gastrointestinal: Soft and nontender. No  distention.  Musculoskeletal: moderate right hip tenderness palpation, neurovascularly intact with 2+ DP pulse, sensation intact. Moderate right wrist tenderness, neurovascular intact distally. No abrasion or laceration noted. Neurologic:  Normal speech and language. No gross focal neurologic deficits Skin:  Skin is warm, dry and intact.  Psychiatric: Mood and affect are normal.  ____________________________________________    EKG  EKG reviewed and interpreted by my social is normal sinus rhythm at 75 bpm, narrow QRS, normal  axis, normal intervals with no concerning ST changes  ____________________________________________    RADIOLOGY  positive right hip fracture Positive right wrist fracture  ____________________________________________   INITIAL IMPRESSION / ASSESSMENT AND PLAN / ED COURSE  Pertinent labs & imaging results that were available during my care of the patient were reviewed by me and considered in my medical decision making (see chart for details).  patient presents to the emergency department after a fall with right wrist and right hip pain. Differential this time would include contusion, musculoskeletal pain, sprain, fracture, dislocation. X-rays are consistent with right hip fracture and right wrist fracture. Discussed the patient with orthopedics. We will check labs and admit to the hospitalist service for further treatment.    ____________________________________________   FINAL CLINICAL IMPRESSION(S) / ED DIAGNOSES  right wrist fracture right hip fracture    Harvest Dark, MD 12/18/16 3143

## 2016-12-18 NOTE — H&P (Signed)
[facility] NAME: Sara Greene    MR#:  [id number]  DATE OF BIRTH:  [birth year]  DATE OF ADMISSION:  12/18/2016  PRIMARY CARE PHYSICIAN: Dion Body, MD   REQUESTING/REFERRING PHYSICIAN: Harvest Dark, MD  CHIEF COMPLAINT:   Chief Complaint  Patient presents with  . Fall  . Hip Pain   Right hip pain after fall today. HISTORY OF PRESENT ILLNESS:  Sara Greene  is a 76 y.o. female with a known history of Chronic A. fib, arthritis, chronic pain, hypertension and depression. The patient presently ED with the above chief complaints. The patient fell on the right side of her body today. She could not stand after the fall due to right hip pain, unable to move right leg. X-ray show right hip fracture. She also complains of right wrist pain. But she denies any syncope, loss of consciousness or headache. She has a hematoma to her left forehead.  PAST MEDICAL HISTORY:   Past Medical History:  Diagnosis Date  . Arthritis   . Atrial fibrillation ([place])   . Chronic pain   . Chronic urethral narrowing    please use the smallest foley catheter available for her  . Depression   . Dysrhythmia    a fib. per patient, dr. lithicum could not confirm this diagnosis  . GERD (gastroesophageal reflux disease)   . Hyperlipidemia   . Hypertension   . Hypokalemia   . Hyponatremia   . Hypothyroidism   . Osteoporosis     PAST SURGICAL HISTORY:   Past Surgical History:  Procedure Laterality Date  . APPENDECTOMY    . BACK SURGERY  1977   no metal in back, laminectomy  . BREAST BIOPSY Left 09/20/2015   PATH PENDING  . BREAST CYST EXCISION Left yrs ago   benign  . CHOLECYSTECTOMY    . COLONOSCOPY    . COLONOSCOPY WITH PROPOFOL N/A 10/15/2014   Procedure: COLONOSCOPY WITH PROPOFOL;  Surgeon: Hulen Luster, MD;  Location: [facility] ENDOSCOPY;  Service: Gastroenterology;  Laterality: N/A;  . DENTAL SURGERY  2016   dental implants to hook dentures  on  . FOOT SURGERY Right 2002   screws in right foot  . fracture Rt arm Right 2002   no metal  . JOINT REPLACEMENT    . ROTATOR CUFF REPAIR Left 1990  . TENDON REPAIR Bilateral   . TOTAL KNEE ARTHROPLASTY Right 07/08/2015   Procedure: TOTAL KNEE ARTHROPLASTY;  Surgeon: Thornton Park, MD;  Location: [facility] ORS;  Service: Orthopedics;  Laterality: Right;    SOCIAL HISTORY:   Social History  Substance Use Topics  . Smoking status: Never Smoker  . Smokeless tobacco: Never Used  . Alcohol use No    FAMILY HISTORY:   Family History  Problem Relation Age of Onset  . Diabetes Mother   . Hypertension Mother   . Stroke Father   . Hypertension Father   . Diabetes Father   . Cancer Father   . Diabetes Sister   . Hypertension Sister     DRUG ALLERGIES:   Allergies  Allergen Reactions  . Lipitor [Atorvastatin] Other (See Comments)    lupus  . Shellfish Allergy Nausea Only    No problems with topical betadine    REVIEW OF SYSTEMS:   Review of Systems  Constitutional: Negative for chills, fever and malaise/fatigue.  HENT: Negative for sore throat.   Eyes: Negative for blurred vision and double vision.  Respiratory: Negative  for cough, hemoptysis, shortness of breath, wheezing and stridor.   Cardiovascular: Negative for chest pain, palpitations, orthopnea and leg swelling.  Gastrointestinal: Negative for abdominal pain, blood in stool, diarrhea, melena, nausea and vomiting.  Genitourinary: Negative for dysuria, flank pain and hematuria.  Musculoskeletal: Positive for joint pain. Negative for back pain.  Skin: Negative for rash.  Neurological: Negative for dizziness, sensory change, focal weakness, seizures, loss of consciousness, weakness and headaches.  Endo/Heme/Allergies: Negative for polydipsia.  Psychiatric/Behavioral: Negative for depression. The patient is not nervous/anxious.     MEDICATIONS AT HOME:   Prior to Admission medications   Medication Sig Start Date  End Date Taking? Authorizing Provider  aspirin EC 325 MG EC tablet Take 1 tablet (325 mg total) by mouth 2 (two) times daily. Patient taking differently: Take 81 mg by mouth 2 (two) times daily.  07/12/15  Yes Thornton Park, MD  calcium-vitamin D (OSCAL WITH D) 500-200 MG-UNIT per tablet Take 2 tablets by mouth daily.    Yes [provider]  doxepin (SINEQUAN) 50 MG capsule Take 50 mg by mouth at bedtime.   Yes [provider]  esomeprazole (NEXIUM) 20 MG capsule Take 20 mg by mouth every morning.    Yes [provider]  gabapentin (NEURONTIN) 600 MG tablet Take 600 mg by mouth every morning.    Yes [provider]  letrozole (FEMARA) 2.5 MG tablet Take 2.5 mg by mouth at bedtime.   Yes [provider]  levothyroxine (SYNTHROID, LEVOTHROID) 100 MCG tablet Take 100 mcg by mouth daily before breakfast.    Yes [provider]  lisinopril (PRINIVIL,ZESTRIL) 5 MG tablet Take 5 mg by mouth daily.   Yes [provider]  morphine (MS CONTIN) 30 MG 12 hr tablet Take 1 tablet (30 mg total) by mouth every 12 (twelve) hours. 07/09/15  Yes Thornton Park, MD  Multiple Vitamins-Minerals (CENTRUM SILVER ADULT 50+ PO) Take 1 tablet by mouth daily.    Yes [provider]  omeprazole (PRILOSEC) 20 MG capsule Take 20 mg by mouth at bedtime as needed.    Yes [provider]  oxybutynin (DITROPAN) 5 MG tablet Take 5 mg by mouth 2 (two) times daily.    Yes [provider]  Oxycodone HCl 10 MG TABS Take 1 tablet (10 mg total) by mouth every 3 (three) hours as needed. 07/09/15  Yes Thornton Park, MD  PARoxetine (PAXIL) 30 MG tablet Take 30 mg by mouth every morning.    Yes [provider]  potassium chloride (K-DUR,KLOR-CON) 10 MEQ tablet Take 10 mEq by mouth daily.    Yes [provider]  pravastatin (PRAVACHOL) 40 MG tablet Take 40 mg by mouth at bedtime.    Yes [provider]      VITAL SIGNS:    Blood pressure (!) 165/66, pulse 83, temperature 98.1 F (36.7 C), temperature source Oral, resp. rate 13, height 5\' 4"  (1.626 m), weight 186 lb (84.4 kg), SpO2 97 %.  PHYSICAL EXAMINATION:  Physical Exam  Constitutional: She is oriented to person, place, and time and well-developed, well-nourished, and in no distress.  HENT:  Head: Normocephalic.  Mouth/Throat: Oropharynx is clear and moist.  Eyes: Pupils are equal, round, and reactive to light. Conjunctivae and EOM are normal. No scleral icterus.  Neck: Normal range of motion. Neck supple. No JVD present. No tracheal deviation present.  Cardiovascular: Normal rate, regular rhythm and normal heart sounds.  Exam reveals no gallop.   No murmur heard.  Pulmonary/Chest: Effort normal and breath sounds normal. No respiratory distress. She has no wheezes. She has no rales.  Abdominal: Soft. Bowel sounds are normal. She exhibits no distension. There is no tenderness. There is no rebound.  Musculoskeletal: She exhibits no edema or tenderness.  Right leg roated deformity.  Neurological: She is alert and oriented to person, place, and time. No cranial nerve deficit.  Skin: No rash noted. No erythema.  Psychiatric: Affect normal.    LABORATORY PANEL:   CBC  Recent Labs Lab 12/18/16 1819  WBC 5.5  HGB 12.4  HCT 35.8  PLT 221   ------------------------------------------------------------------------------------------------------------------  Chemistries   Recent Labs Lab 12/18/16 1819  NA 133*  K 4.2  CL 96*  CO2 27  GLUCOSE 110*  BUN 9  CREATININE 1.06*  CALCIUM 9.1   ------------------------------------------------------------------------------------------------------------------  Cardiac Enzymes No results for input(s): TROPONINI in the last 168 hours. ------------------------------------------------------------------------------------------------------------------  RADIOLOGY:  Dg Wrist Complete Right  Result Date:  12/18/2016 CLINICAL DATA:  Right wrist pain after fall in kitchen today. EXAM: RIGHT WRIST - COMPLETE 3+ VIEW COMPARISON:  None. FINDINGS: Exam demonstrates a mildly displaced comminuted fracture of the distal radial metaphysis extending to the articular surface. Subtle fracture of the tip of the ulnar styloid. Degenerate change of the radiocarpal joint and carpal bones as well as first carpometacarpal joint. IMPRESSION: Mildly displaced comminuted fracture of the distal radial metaphysis with extension to the articular surface. Subtle ulnar styloid fracture. Electronically Signed   By: Marin Olp M.D.   On: 12/18/2016 19:31   Ct Head Wo Contrast  Result Date: 12/18/2016 CLINICAL DATA:  Complaints of right hip pain and right wrist pain after falling in the kitchen. Pt does not recall mechanism of fall, states she landed on right side, unable to tell me if she hit her head, denies LOC. EXAM: CT HEAD WITHOUT CONTRAST TECHNIQUE: Contiguous axial images were obtained from the base of the skull through the vertex without intravenous contrast. COMPARISON:  None. FINDINGS: Brain: No evidence of acute infarction, hemorrhage, hydrocephalus, extra-axial collection or mass lesion/mass effect. Vascular: No hyperdense vessel or unexpected calcification. Skull: No osseous abnormality. Sinuses/Orbits: Visualized paranasal sinuses are clear. Visualized mastoid sinuses are clear. Visualized orbits demonstrate no focal abnormality. Other: None IMPRESSION: No intracranial pathology. Electronically Signed   By: Kathreen Devoid   On: 12/18/2016 20:23   Dg Chest Portable 1 View  Result Date: 12/18/2016 CLINICAL DATA:  Preop chest for hip fracture EXAM: PORTABLE CHEST 1 VIEW COMPARISON:  08/20/2013 FINDINGS: The heart size and mediastinal contours are within normal limits. Both lungs are clear. The visualized skeletal structures are unremarkable. Dilated esophagus. IMPRESSION: No active disease. Electronically Signed   By: Kathreen Devoid   On: 12/18/2016 20:14   Dg Hip Unilat  With Pelvis 2-3 Views Right  Result Date: 12/18/2016 CLINICAL DATA:  Right hip pain after falling kitchen tonight. EXAM: DG HIP (WITH OR WITHOUT PELVIS) 2-3V RIGHT COMPARISON:  None. FINDINGS: There is a displaced slightly comminuted fracture of the intertrochanteric region of the right femur with displaced lesser trochanter. Minimal symmetric degenerative change of the hips. Degenerative change of the spine. IMPRESSION: Displaced right femoral intertrochanteric fracture. Electronically Signed   By: Marin Olp M.D.   On: 12/18/2016 19:32      IMPRESSION AND PLAN:   Right hip fracture. The patient will be admitted to medical floor. Pain control, nothing by mouth after midnight, follow-up orthopedic surgeon for surgery tomorrow. The patient has moderate risk  for right hip surgery. PT evaluation and DVT prophylaxis after surgery.  Hyponatremia with mild dehydration. Start normal saline IV and follow-up BMP.  History of chronic A. fib. Normal sinus rhythm now. Hold aspirin for surgery.  Hypertension. Continue home hypertension medication.  All the records are reviewed and case discussed with ED provider. Management plans discussed with the patient, husband and daughter, and they are in agreement.  CODE STATUS: Full code  TOTAL TIME TAKING CARE OF THIS PATIENT: 55 minutes.    Demetrios Loll M.D on 12/18/2016 at 9:01 PM  Between 7am to 6pm - Pager - [phone]  After 6pm go to [email or web address] - Proofreader  [facility]  Office  [phone]  CC: Primary care physician; Dion Body, MD   Note: This dictation was prepared with Dragon dictation along with smaller phrase technology. Any transcriptional errors that result from this process are unin

## 2016-12-18 NOTE — ED Notes (Signed)
Patient transported to X-ray 

## 2016-12-18 NOTE — Consult Note (Signed)
ORTHOPAEDIC CONSULTATION  REQUESTING PHYSICIAN: Max Sane, MD  Chief Complaint:   R wrist and R hip pain  History of Present Illness: Sara Greene is a 76 y.o. female who was in the kitchen and had a sudden fall directly onto her R hand and hip. She was unable to ambulate afterwards. She did not lose consciousness. She states she had not been feeling well earlier in the day and had been sleeping. She ambulates unassisted at baseline. She has had a prior R wrist fracture treated non-operatively. X-rays in the ED show R intertrochanteric femur fracture and R distal radius fracture. She is only on Aspirin as a blood thinner. She has also had a prior R TKA. She is seen by Pain Management at [facility] for her back pain. She believes she takes MS Contin (30mg  bid) and oxycodone (~40mg /day).  Husband and daughter also at bedside and able to assist with history.   Past Medical History:  Diagnosis Date  . Arthritis   . Atrial fibrillation ([place])   . Chronic pain   . Chronic urethral narrowing    please use the smallest foley catheter available for her  . Depression   . Dysrhythmia    a fib. per patient, dr. lithicum could not confirm this diagnosis  . GERD (gastroesophageal reflux disease)   . Hyperlipidemia   . Hypertension   . Hypokalemia   . Hyponatremia   . Hypothyroidism   . Osteoporosis    Past Surgical History:  Procedure Laterality Date  . APPENDECTOMY    . BACK SURGERY  1977   no metal in back, laminectomy  . BREAST BIOPSY Left 09/20/2015   PATH PENDING  . BREAST CYST EXCISION Left yrs ago   benign  . CHOLECYSTECTOMY    . COLONOSCOPY    . COLONOSCOPY WITH PROPOFOL N/A 10/15/2014   Procedure: COLONOSCOPY WITH PROPOFOL;  Surgeon: Hulen Luster, MD;  Location: [facility] ENDOSCOPY;  Service: Gastroenterology;  Laterality: N/A;  . DENTAL SURGERY  2016   dental implants to hook dentures on  . FOOT SURGERY Right 2002   screws  in right foot  . fracture Rt arm Right 2002   no metal  . JOINT REPLACEMENT    . ROTATOR CUFF REPAIR Left 1990  . TENDON REPAIR Bilateral   . TOTAL KNEE ARTHROPLASTY Right 07/08/2015   Procedure: TOTAL KNEE ARTHROPLASTY;  Surgeon: Thornton Park, MD;  Location: [facility] ORS;  Service: Orthopedics;  Laterality: Right;   Social History   Social History  . Marital status: Married    Spouse name: N/A  . Number of children: N/A  . Years of education: N/A   Social History Main Topics  . Smoking status: Never Smoker  . Smokeless tobacco: Never Used  . Alcohol use No  . Drug use: No  . Sexual activity: No   Other Topics Concern  . None   Social History Narrative  . None   Family History  Problem Relation Age of Onset  . Diabetes Mother   . Hypertension Mother   . Stroke Father   . Hypertension Father   . Diabetes Father   . Cancer Father   . Diabetes Sister   . Hypertension Sister    Allergies  Allergen Reactions  . Lipitor [Atorvastatin] Other (See Comments)    lupus  . Shellfish Allergy Nausea Only    No problems with topical betadine   Prior to Admission medications   Medication Sig Start Date End Date Taking? Authorizing Provider  aspirin EC 325 MG EC tablet Take 1 tablet (325 mg total) by mouth 2 (two) times daily. Patient taking differently: Take 81 mg by mouth 2 (two) times daily.  07/12/15  Yes Thornton Park, MD  calcium-vitamin D (OSCAL WITH D) 500-200 MG-UNIT per tablet Take 2 tablets by mouth daily.    Yes [provider]  doxepin (SINEQUAN) 50 MG capsule Take 50 mg by mouth at bedtime.   Yes [provider]  esomeprazole (NEXIUM) 20 MG capsule Take 20 mg by mouth every morning.    Yes [provider]  gabapentin (NEURONTIN) 600 MG tablet Take 600 mg by mouth every morning.    Yes [provider]  letrozole (FEMARA) 2.5 MG tablet Take 2.5 mg by mouth at bedtime.   Yes [provider]  levothyroxine (SYNTHROID,  LEVOTHROID) 100 MCG tablet Take 100 mcg by mouth daily before breakfast.    Yes [provider]  lisinopril (PRINIVIL,ZESTRIL) 5 MG tablet Take 5 mg by mouth daily.   Yes [provider]  morphine (MS CONTIN) 30 MG 12 hr tablet Take 1 tablet (30 mg total) by mouth every 12 (twelve) hours. 07/09/15  Yes Thornton Park, MD  Multiple Vitamins-Minerals (CENTRUM SILVER ADULT 50+ PO) Take 1 tablet by mouth daily.    Yes [provider]  omeprazole (PRILOSEC) 20 MG capsule Take 20 mg by mouth at bedtime as needed.    Yes [provider]  oxybutynin (DITROPAN) 5 MG tablet Take 5 mg by mouth 2 (two) times daily.    Yes [provider]  Oxycodone HCl 10 MG TABS Take 1 tablet (10 mg total) by mouth every 3 (three) hours as needed. 07/09/15  Yes Thornton Park, MD  PARoxetine (PAXIL) 30 MG tablet Take 30 mg by mouth every morning.    Yes [provider]  potassium chloride (K-DUR,KLOR-CON) 10 MEQ tablet Take 10 mEq by mouth daily.    Yes [provider]  pravastatin (PRAVACHOL) 40 MG tablet Take 40 mg by mouth at bedtime.    Yes [provider]   Dg Wrist Complete Right  Result Date: 12/18/2016 CLINICAL DATA:  Right wrist pain after fall in kitchen today. EXAM: RIGHT WRIST - COMPLETE 3+ VIEW COMPARISON:  None. FINDINGS: Exam demonstrates a mildly displaced comminuted fracture of the distal radial metaphysis extending to the articular surface. Subtle fracture of the tip of the ulnar styloid. Degenerate change of the radiocarpal joint and carpal bones as well as first carpometacarpal joint. IMPRESSION: Mildly displaced comminuted fracture of the distal radial metaphysis with extension to the articular surface. Subtle ulnar styloid fracture. Electronically Signed   By: Marin Olp M.D.   On: 12/18/2016 19:31   Ct Head Wo Contrast  Result Date: 12/18/2016 CLINICAL DATA:  Complaints of right hip pain and right wrist pain after falling in the  kitchen. Pt does not recall mechanism of fall, states she landed on right side, unable to tell me if she hit her head, denies LOC. EXAM: CT HEAD WITHOUT CONTRAST TECHNIQUE: Contiguous axial images were obtained from the base of the skull through the vertex without intravenous contrast. COMPARISON:  None. FINDINGS: Brain: No evidence of acute infarction, hemorrhage, hydrocephalus, extra-axial collection or mass lesion/mass effect. Vascular: No hyperdense vessel or unexpected calcification. Skull: No osseous abnormality. Sinuses/Orbits: Visualized paranasal sinuses are clear. Visualized mastoid sinuses are clear. Visualized orbits demonstrate no focal abnormality. Other: None IMPRESSION: No intracranial pathology. Electronically Signed   By: Kathreen Devoid  On: 12/18/2016 20:23   Dg Chest Portable 1 View  Result Date: 12/18/2016 CLINICAL DATA:  Preop chest for hip fracture EXAM: PORTABLE CHEST 1 VIEW COMPARISON:  08/20/2013 FINDINGS: The heart size and mediastinal contours are within normal limits. Both lungs are clear. The visualized skeletal structures are unremarkable. Dilated esophagus. IMPRESSION: No active disease. Electronically Signed   By: Kathreen Devoid   On: 12/18/2016 20:14   Dg Knee Right Port  Result Date: 12/18/2016 CLINICAL DATA:  Right hip surgery tomorrow. Previous right knee replacement. EXAM: PORTABLE RIGHT KNEE - 1-2 VIEW COMPARISON:  07/08/2015 FINDINGS: Postoperative changes with right total knee arthroplasty. Patellar femoral component is present. Components appear well seated without change in orientation since previous study. Mild heterotopic ossification around the tibial component. No evidence of loosening. No evidence of acute fracture or dislocation. No focal bone lesion or bone destruction. No significant effusion. IMPRESSION: Previous right total knee arthroplasty. Components appear well seated. No acute bony abnormalities. Electronically Signed   By: Lucienne Capers M.D.   On:  12/18/2016 22:41   Dg Hip Unilat  With Pelvis 2-3 Views Right  Result Date: 12/18/2016 CLINICAL DATA:  Right hip pain after falling kitchen tonight. EXAM: DG HIP (WITH OR WITHOUT PELVIS) 2-3V RIGHT COMPARISON:  None. FINDINGS: There is a displaced slightly comminuted fracture of the intertrochanteric region of the right femur with displaced lesser trochanter. Minimal symmetric degenerative change of the hips. Degenerative change of the spine. IMPRESSION: Displaced right femoral intertrochanteric fracture. Electronically Signed   By: Marin Olp M.D.   On: 12/18/2016 19:32    Positive ROS: All other systems have been reviewed and were otherwise negative with the exception of those mentioned in the HPI and as above.  Physical Exam: General:  Alert, no acute distress Psychiatric:  Patient is competent for consent with normal mood and affect   Cardiovascular:  No pedal edema, positive and regular peripheral pulses Respiratory:  No wheezing, non-labored breathing, chest sounds clear GI:  Abdomen is soft and non-tender Skin:  No lesions in the area of chief complaint Neurologic:  Sensation intact distally Lymphatic:  No axillary or cervical lymphadenopathy  Orthopedic Exam:  RUE: - in splint from ED - able to wiggle fingers. +AIN/PIN/u motor - SILT r/u/m distr in hand - fingers wpp  RLE: - +logroll & axial load - +ankle DF/PF, EHL - SILT s/s/t/sp/dp distr - +foot wwp, + distal pulse - no skin lesions about lateral hip/femur  X-rays:  As above. - R intertrochanteric femur fracture - R distal radius fracture  Assessment: 76 yo F w/R intertrochanteric femur fracture and R distal radius fracture  Plan: - Plan for OR for R cephalomedullary nail. Plan to perform on afternoon/evening of 12/19/16.  - NPO starting now - Will discuss R distal radius fracture with other orthopedic partners regarding definitive management.      Leim Fabry   12/18/2016 11:47 PM

## 2016-12-19 ENCOUNTER — Inpatient Hospital Stay: Payer: Medicare Other | Admitting: Anesthesiology

## 2016-12-19 ENCOUNTER — Inpatient Hospital Stay: Payer: Medicare Other

## 2016-12-19 ENCOUNTER — Encounter
Admission: EM | Disposition: A | Discharge status: Skilled Nursing Facility | Payer: Self-pay | Source: Home / Self Care | Attending: Internal Medicine

## 2016-12-19 HISTORY — PX: INTRAMEDULLARY (IM) NAIL INTERTROCHANTERIC: SHX5875

## 2016-12-19 LAB — PROTIME-INR
INR: 1.07
Prothrombin Time: 13.8 s (ref 11.4–15.2)

## 2016-12-19 SURGERY — FIXATION, FRACTURE, INTERTROCHANTERIC, WITH INTRAMEDULLARY ROD
Anesthesia: Spinal | Site: Hip | Laterality: Right | Wound class: Clean

## 2016-12-19 MED ORDER — MIDAZOLAM HCL 5 MG/5ML IJ SOLN
INTRAMUSCULAR | Status: DC | PRN
  Administered 2016-12-19: 2 mg via INTRAVENOUS

## 2016-12-19 MED ORDER — SODIUM CHLORIDE 0.9 % IV SOLN
INTRAVENOUS | Status: DC | PRN
  Administered 2016-12-19: 20 ug/min via INTRAVENOUS
  Administered 2016-12-19: 30 ug/min via INTRAVENOUS

## 2016-12-19 MED ORDER — MIDAZOLAM HCL 2 MG/2ML IJ SOLN
INTRAMUSCULAR | Status: AC
  Filled 2016-12-19: qty 2

## 2016-12-19 MED ORDER — PROPOFOL 10 MG/ML IV BOLUS
INTRAVENOUS | Status: AC
  Filled 2016-12-19: qty 20

## 2016-12-19 MED ORDER — SENNA 8.6 MG PO TABS
1.0000 | ORAL_TABLET | Freq: Two times a day (BID) | ORAL | Status: DC
  Administered 2016-12-20 – 2016-12-22 (×5): 8.6 mg via ORAL
  Filled 2016-12-19 (×4): qty 1

## 2016-12-19 MED ORDER — SODIUM CHLORIDE 0.9 % IR SOLN
Status: DC | PRN
  Administered 2016-12-19: 1000 mL

## 2016-12-19 MED ORDER — PHENYLEPHRINE HCL 10 MG/ML IJ SOLN
INTRAMUSCULAR | Status: DC | PRN
  Administered 2016-12-19: 100 ug via INTRAVENOUS
  Administered 2016-12-19: 50 ug via INTRAVENOUS
  Administered 2016-12-19: 100 ug via INTRAVENOUS
  Administered 2016-12-19: 50 ug via INTRAVENOUS

## 2016-12-19 MED ORDER — PROPOFOL 500 MG/50ML IV EMUL
INTRAVENOUS | Status: DC | PRN
  Administered 2016-12-19: 50 ug/kg/min via INTRAVENOUS

## 2016-12-19 MED ORDER — FENTANYL CITRATE (PF) 100 MCG/2ML IJ SOLN
INTRAMUSCULAR | Status: DC | PRN
  Administered 2016-12-19 – 2016-12-20 (×3): 50 ug via INTRAVENOUS

## 2016-12-19 MED ORDER — BUPIVACAINE HCL (PF) 0.5 % IJ SOLN
INTRAMUSCULAR | Status: DC | PRN
Start: 2016-12-19 — End: 2016-12-20
  Administered 2016-12-19: 3 mL

## 2016-12-19 MED ORDER — CEFAZOLIN SODIUM-DEXTROSE 2-3 GM-% IV SOLR
INTRAVENOUS | Status: DC | PRN
  Administered 2016-12-19: 2 g via INTRAVENOUS

## 2016-12-19 MED ORDER — LACTATED RINGERS IV SOLN
INTRAVENOUS | Status: DC | PRN
  Administered 2016-12-19 – 2016-12-20 (×3): via INTRAVENOUS

## 2016-12-19 MED ORDER — FENTANYL CITRATE (PF) 100 MCG/2ML IJ SOLN
INTRAMUSCULAR | Status: AC
  Filled 2016-12-19: qty 2

## 2016-12-19 MED ORDER — PROPOFOL 500 MG/50ML IV EMUL
INTRAVENOUS | Status: AC
  Filled 2016-12-19: qty 50

## 2016-12-19 SURGICAL SUPPLY — 48 items
BIT DRILL AO GAMMA 4.2X180 (BIT) ×6 IMPLANT
CANISTER SUCT 1200ML W/VALVE (MISCELLANEOUS) ×3 IMPLANT
CHLORAPREP W/TINT 26ML (MISCELLANEOUS) ×3 IMPLANT
DRAPE C-ARM XRAY 36X54 (DRAPES) ×3 IMPLANT
DRAPE SHEET LG 3/4 BI-LAMINATE (DRAPES) ×6 IMPLANT
DRAPE SURG 17X11 SM STRL (DRAPES) ×6 IMPLANT
DRAPE U-SHAPE 47X51 STRL (DRAPES) ×3 IMPLANT
DRSG TEGADERM 2-3/8X2-3/4 SM (GAUZE/BANDAGES/DRESSINGS) ×3 IMPLANT
DRSG TEGADERM 4X4.75 (GAUZE/BANDAGES/DRESSINGS) ×9 IMPLANT
ELECT REM PT RETURN 9FT ADLT (ELECTROSURGICAL) ×3
ELECTRODE REM PT RTRN 9FT ADLT (ELECTROSURGICAL) ×1 IMPLANT
GAUZE SPONGE 4X4 12PLY STRL (GAUZE/BANDAGES/DRESSINGS) ×3 IMPLANT
GAUZE XEROFORM 4X4 STRL (GAUZE/BANDAGES/DRESSINGS) ×6 IMPLANT
GLOVE BIOGEL PI IND STRL 8 (GLOVE) ×2 IMPLANT
GLOVE BIOGEL PI INDICATOR 8 (GLOVE) ×4
GLOVE SURG SYN 7.5  E (GLOVE) ×8
GLOVE SURG SYN 7.5 E (GLOVE) ×4 IMPLANT
GOWN STRL REUS W/ TWL LRG LVL3 (GOWN DISPOSABLE) ×1 IMPLANT
GOWN STRL REUS W/ TWL XL LVL3 (GOWN DISPOSABLE) ×1 IMPLANT
GOWN STRL REUS W/TWL LRG LVL3 (GOWN DISPOSABLE) ×2
GOWN STRL REUS W/TWL XL LVL3 (GOWN DISPOSABLE) ×2
GUIDEROD T2 3X1000 (ROD) ×3 IMPLANT
HOLDER FOLEY CATH W/STRAP (MISCELLANEOUS) ×3 IMPLANT
K-WIRE  3.2X450M STR (WIRE) ×2
K-WIRE 3.2X450M STR (WIRE) ×1
KIT NAIL LONG 10X380MMX125 (Nail) ×3 IMPLANT
KIT RM TURNOVER STRD PROC AR (KITS) ×3 IMPLANT
KWIRE 3.2X450M STR (WIRE) ×1 IMPLANT
MAT BLUE FLOOR 46X72 FLO (MISCELLANEOUS) IMPLANT
NEEDLE FILTER BLUNT 18X 1/2SAF (NEEDLE) ×2
NEEDLE FILTER BLUNT 18X1 1/2 (NEEDLE) ×1 IMPLANT
NS IRRIG 1000ML POUR BTL (IV SOLUTION) ×3 IMPLANT
PACK HIP COMPR (MISCELLANEOUS) ×3 IMPLANT
PAD ABD DERMACEA PRESS 5X9 (GAUZE/BANDAGES/DRESSINGS) ×6 IMPLANT
PENCIL ELECTRO HAND CTR (MISCELLANEOUS) ×3 IMPLANT
REAMER SHAFT BIXCUT (INSTRUMENTS) ×3 IMPLANT
SCREW LAG GAMMA 3 95MM (Screw) ×3 IMPLANT
SCREW LOCKING T2 F/T  5MMX45MM (Screw) ×2 IMPLANT
SCREW LOCKING T2 F/T  5MMX50MM (Screw) ×2 IMPLANT
SCREW LOCKING T2 F/T 5MMX45MM (Screw) ×1 IMPLANT
SCREW LOCKING T2 F/T 5MMX50MM (Screw) ×1 IMPLANT
SPONGE LAP 18X18 5 PK (GAUZE/BANDAGES/DRESSINGS) ×3 IMPLANT
STAPLER SKIN PROX 35W (STAPLE) ×3 IMPLANT
SUT VIC AB 1 CT1 36 (SUTURE) ×3 IMPLANT
SUT VIC AB 2-0 CT1 (SUTURE) ×3 IMPLANT
SUT VIC AB 2-0 CT2 27 (SUTURE) ×6 IMPLANT
SYRINGE 10CC LL (SYRINGE) ×3 IMPLANT
TRAY FOLEY SILVER 14FR TEMP (SET/KITS/TRAYS/PACK) ×3 IMPLANT

## 2016-12-19 NOTE — Progress Notes (Signed)
[name] NAME: Sara Greene    MR#:  [id number]  DATE OF BIRTH:  [birth year]  SUBJECTIVE:  CHIEF COMPLAINT:   Chief Complaint  Patient presents with  . Fall  . Hip Pain  in lot of pain, complaints of ear fullness and something crawling in the ears, also having severe headache REVIEW OF SYSTEMS:  Review of Systems  Constitutional: Positive for malaise/fatigue. Negative for chills, fever and weight loss.  HENT: Positive for ear pain. Negative for nosebleeds and sore throat.   Eyes: Negative for blurred vision.  Respiratory: Negative for cough, shortness of breath and wheezing.   Cardiovascular: Negative for chest pain, orthopnea, leg swelling and PND.  Gastrointestinal: Negative for abdominal pain, constipation, diarrhea, heartburn, nausea and vomiting.  Genitourinary: Negative for dysuria and urgency.  Musculoskeletal: Positive for falls and joint pain. Negative for back pain.  Skin: Negative for rash.  Neurological: Positive for weakness and headaches. Negative for dizziness, speech change and focal weakness.  Endo/Heme/Allergies: Does not bruise/bleed easily.  Psychiatric/Behavioral: Negative for depression.    DRUG ALLERGIES:   Allergies  Allergen Reactions  . Lipitor [Atorvastatin] Other (See Comments)    lupus  . Shellfish Allergy Nausea Only    No problems with topical betadine   VITALS:  Blood pressure (!) 159/56, pulse 86, temperature 98.5 F (36.9 C), temperature source Oral, resp. rate 19, height 5\' 4"  (1.626 m), weight 86.7 kg (191 lb 1.6 oz), SpO2 100 %. PHYSICAL EXAMINATION:  Physical Exam  Constitutional: She is oriented to person, place, and time and well-developed, well-nourished, and in no distress.  HENT:  Head: Normocephalic. Head is with contusion.  Right Ear: Tympanic membrane is bulging. Tympanic membrane mobility is abnormal.  Left Ear: Tympanic membrane is bulging. Tympanic membrane  mobility is abnormal.  Eyes: Pupils are equal, round, and reactive to light. Conjunctivae and EOM are normal.  Neck: Normal range of motion. Neck supple. No tracheal deviation present. No thyromegaly present.  Cardiovascular: Normal rate, regular rhythm and normal heart sounds.   Pulmonary/Chest: Effort normal and breath sounds normal. No respiratory distress. She has no wheezes. She exhibits no tenderness.  Abdominal: Soft. Bowel sounds are normal. She exhibits no distension. There is no tenderness.  Musculoskeletal:       Right hip: She exhibits decreased range of motion, decreased strength, tenderness, bony tenderness, swelling and deformity.       Right forearm: She exhibits tenderness.  RUE: - in splint   Neurological: She is alert and oriented to person, place, and time. No cranial nerve deficit.  Skin: Skin is warm and dry. No rash noted.  Psychiatric: Mood and affect normal.   LABORATORY PANEL:  Female CBC  Recent Labs Lab 12/18/16 1819  WBC 5.5  HGB 12.4  HCT 35.8  PLT 221   ------------------------------------------------------------------------------------------------------------------ Chemistries   Recent Labs Lab 12/18/16 1819  NA 133*  K 4.2  CL 96*  CO2 27  GLUCOSE 110*  BUN 9  CREATININE 1.06*  CALCIUM 9.1   RADIOLOGY:  Dg Wrist Complete Right  Result Date: 12/18/2016 CLINICAL DATA:  Right wrist pain after fall in kitchen today. EXAM: RIGHT WRIST - COMPLETE 3+ VIEW COMPARISON:  None. FINDINGS: Exam demonstrates a mildly displaced comminuted fracture of the distal radial metaphysis extending to the articular surface. Subtle fracture of the tip of the ulnar styloid. Degenerate change of the radiocarpal joint and carpal bones  as well as first carpometacarpal joint. IMPRESSION: Mildly displaced comminuted fracture of the distal radial metaphysis with extension to the articular surface. Subtle ulnar styloid fracture. Electronically Signed   By: Marin Olp  M.D.   On: 12/18/2016 19:31   Ct Head Wo Contrast  Result Date: 12/18/2016 CLINICAL DATA:  Complaints of right hip pain and right wrist pain after falling in the kitchen. Pt does not recall mechanism of fall, states she landed on right side, unable to tell me if she hit her head, denies LOC. EXAM: CT HEAD WITHOUT CONTRAST TECHNIQUE: Contiguous axial images were obtained from the base of the skull through the vertex without intravenous contrast. COMPARISON:  None. FINDINGS: Brain: No evidence of acute infarction, hemorrhage, hydrocephalus, extra-axial collection or mass lesion/mass effect. Vascular: No hyperdense vessel or unexpected calcification. Skull: No osseous abnormality. Sinuses/Orbits: Visualized paranasal sinuses are clear. Visualized mastoid sinuses are clear. Visualized orbits demonstrate no focal abnormality. Other: None IMPRESSION: No intracranial pathology. Electronically Signed   By: Kathreen Devoid   On: 12/18/2016 20:23   Dg Chest Portable 1 View  Result Date: 12/18/2016 CLINICAL DATA:  Preop chest for hip fracture EXAM: PORTABLE CHEST 1 VIEW COMPARISON:  08/20/2013 FINDINGS: The heart size and mediastinal contours are within normal limits. Both lungs are clear. The visualized skeletal structures are unremarkable. Dilated esophagus. IMPRESSION: No active disease. Electronically Signed   By: Kathreen Devoid   On: 12/18/2016 20:14   Dg Knee Right Port  Result Date: 12/18/2016 CLINICAL DATA:  Right hip surgery tomorrow. Previous right knee replacement. EXAM: PORTABLE RIGHT KNEE - 1-2 VIEW COMPARISON:  07/08/2015 FINDINGS: Postoperative changes with right total knee arthroplasty. Patellar femoral component is present. Components appear well seated without change in orientation since previous study. Mild heterotopic ossification around the tibial component. No evidence of loosening. No evidence of acute fracture or dislocation. No focal bone lesion or bone destruction. No significant effusion.  IMPRESSION: Previous right total knee arthroplasty. Components appear well seated. No acute bony abnormalities. Electronically Signed   By: Lucienne Capers M.D.   On: 12/18/2016 22:41   Dg Hip Unilat  With Pelvis 2-3 Views Right  Result Date: 12/18/2016 CLINICAL DATA:  Right hip pain after falling kitchen tonight. EXAM: DG HIP (WITH OR WITHOUT PELVIS) 2-3V RIGHT COMPARISON:  None. FINDINGS: There is a displaced slightly comminuted fracture of the intertrochanteric region of the right femur with displaced lesser trochanter. Minimal symmetric degenerative change of the hips. Degenerative change of the spine. IMPRESSION: Displaced right femoral intertrochanteric fracture. Electronically Signed   By: Marin Olp M.D.   On: 12/18/2016 19:32   ASSESSMENT AND PLAN:  76 y.o. female who was in the kitchen and had a sudden fall directly onto her R hand and hip. She was unable to ambulate afterwards. X-rays in the ED show R intertrochanteric femur fracture and R distal radius fracture.   *Preop medical clearance  - right hip and distal radius fracture. -Patient is at moderate risk for planned surgery considering her advanced age, history of atrial fibrillation, hypertension, osteoporosis, depression - her biggest challenge could be Confusion postoperatively although she is been using long-term chronic narcotic  * Hyponatremia with mild dehydration. -Monitor with IV fluids  * History of chronic A. fib. Normal sinus rhythm now. Hold aspirin for surgery.  * Hypertension. Continue lisinopril  *Ear pain -On my otoscope exam I could not see anything other than bulging tympanic membrane there was no earwax or any foreign body as she complained  of crawling sensation in the ear -If this continues she may need ENT evaluation   All the records are reviewed and case discussed with Care Management/Social Worker. Management plans discussed with the patient, family (daughter at bedside) and they are in  agreement.  CODE STATUS: Full Code  TOTAL TIME TAKING CARE OF THIS PATIENT: 35 minutes.   More than 50% of the time was spent in counseling/coordination of care: YES  POSSIBLE D/C IN 2-3 DAYS, DEPENDING ON CLINICAL CONDITION.  And orthopedic evaluation   Max Sane M.D on 12/19/2016 at 1:11 PM  Between 7am to 6pm - Pager - [phone]  After 6pm go to [email or web address] - Proofreader  [facility]  Office  [phone]  CC: Primary care physician; Dion Body, MD  Note: This dictation was prepared with Dragon dictation along with smaller phrase technology. Any transcriptional errors that result from this process are unintentional.

## 2016-12-19 NOTE — H&P (Signed)
H&P reviewed. No significant changes noted.  

## 2016-12-19 NOTE — NC FL2 (Signed)
[place] LEVEL OF CARE SCREENING TOOL     IDENTIFICATION  Patient Name: Sara Greene Birthdate: 07-11-1940 Sex: female Admission Date (Current Location): 12/18/2016  [place] and [place] Number:  Engineering geologist and Address:  [facility], [address], [place], [place] [address]      Provider Number: [id number]  Attending Physician Name and Address:  Max Sane, MD  Relative Name and Phone Number:       Current Level of Care: Hospital Recommended Level of Care: [place] Prior Approval Number:    Date Approved/Denied:   PASRR Number:   [id number]  Discharge Plan: SNF    Current Diagnoses: Patient Active Problem List   Diagnosis Date Noted  . Closed right hip fracture ([place]) 12/18/2016  . History of total knee arthroplasty 07/08/2015    Orientation RESPIRATION BLADDER Height & Weight     Time, Situation, Place, Self  Normal Continent Weight: 191 lb 1.6 oz (86.7 kg) Height:  5\' 4"  (162.6 cm)  BEHAVIORAL SYMPTOMS/MOOD NEUROLOGICAL BOWEL NUTRITION STATUS      Continent Diet (NPO to be advanced)  AMBULATORY STATUS COMMUNICATION OF NEEDS Skin   Extensive Assist Verbally Surgical wounds (Right Hip)                       Personal Care Assistance Level of Assistance  Bathing, Feeding, Dressing Bathing Assistance: Limited assistance Feeding assistance: Independent Dressing Assistance: Limited assistance     Functional Limitations Info  Sight, Hearing, Speech Sight Info: Adequate Hearing Info: Adequate Speech Info: Adequate    SPECIAL CARE FACTORS FREQUENCY  PT (By licensed PT), OT (By licensed OT)     PT Frequency:  (5) OT Frequency:  (5)            Contractures      Additional Factors Info  Allergies, Code Status Code Status Info:  (Full Code) Allergies Info:  (LIPITOR ATORVASTATIN, SHELLFISH ALLERGY )           Current Medications (12/19/2016):  This is the current hospital active  medication list Current Facility-Administered Medications  Medication Dose Route Frequency Provider Last Rate Last Dose  . 0.9 %  sodium chloride infusion   Intravenous Continuous Demetrios Loll, MD 75 mL/hr at 12/18/16 2236    . acetaminophen (TYLENOL) tablet 650 mg  650 mg Oral Q6H PRN Demetrios Loll, MD   650 mg at 12/19/16 3220   Or  . acetaminophen (TYLENOL) suppository 650 mg  650 mg Rectal Q6H PRN Demetrios Loll, MD      . albuterol (PROVENTIL) (2.5 MG/3ML) 0.083% nebulizer solution 2.5 mg  2.5 mg Nebulization Q2H PRN Demetrios Loll, MD      . bisacodyl (DULCOLAX) EC tablet 5 mg  5 mg Oral Daily PRN Demetrios Loll, MD      . docusate sodium (COLACE) capsule 100 mg  100 mg Oral BID Demetrios Loll, MD   100 mg at 12/18/16 2242  . doxepin (SINEQUAN) capsule 50 mg  50 mg Oral QHS Demetrios Loll, MD   50 mg at 12/18/16 2321  . gabapentin (NEURONTIN) capsule 600 mg  600 mg Oral Inez Catalina, Sheppard Evens, MD      . ketorolac (TORADOL) 15 MG/ML injection 15 mg  15 mg Intravenous Q6H PRN Demetrios Loll, MD   15 mg at 12/19/16 0315  . levothyroxine (SYNTHROID, LEVOTHROID) tablet 100 mcg  100 mcg Oral QAC breakfast Demetrios Loll, MD   100 mcg at 12/19/16  0750  . lisinopril (PRINIVIL,ZESTRIL) tablet 5 mg  5 mg Oral Daily Demetrios Loll, MD   5 mg at 12/18/16 2241  . morphine (MS CONTIN) 12 hr tablet 30 mg  30 mg Oral Q12H Demetrios Loll, MD   30 mg at 12/18/16 2242  . ondansetron (ZOFRAN) tablet 4 mg  4 mg Oral Q6H PRN Demetrios Loll, MD       Or  . ondansetron [facility]) injection 4 mg  4 mg Intravenous Q6H PRN Demetrios Loll, MD      . oxybutynin (DITROPAN) tablet 5 mg  5 mg Oral BID Demetrios Loll, MD   5 mg at 12/18/16 2320  . oxyCODONE (Oxy IR/ROXICODONE) immediate release tablet 10 mg  10 mg Oral Q6H PRN Demetrios Loll, MD   10 mg at 12/19/16 0547  . pantoprazole (PROTONIX) EC tablet 40 mg  40 mg Oral Daily Demetrios Loll, MD   40 mg at 12/18/16 2242  . PARoxetine (PAXIL) tablet 30 mg  30 mg Oral Inez Catalina, Sheppard Evens, MD      . potassium chloride (K-DUR,KLOR-CON) CR  tablet 10 mEq  10 mEq Oral Daily Demetrios Loll, MD   10 mEq at 12/18/16 2241  . pravastatin (PRAVACHOL) tablet 40 mg  40 mg Oral QHS Demetrios Loll, MD   40 mg at 12/18/16 2240  . senna-docusate (Senokot-S) tablet 1 tablet  1 tablet Oral QHS PRN Demetrios Loll, MD      . zolpidem [facility]) tablet 5 mg  5 mg Oral QHS PRN Demetrios Loll, MD         Discharge Medications: Please see discharge summary for a list of discharge medications.  Relevant Imaging Results:  Relevant Lab Results:   Additional Information  (SSN: [id number])  Smith Mince, Student-Social Work

## 2016-12-19 NOTE — Clinical Social Work Placement (Signed)
   CLINICAL SOCIAL WORK PLACEMENT  NOTE  Date:  12/19/2016  Patient Details  Name: Sara Greene MRN: [id number] Date of Birth: 19-Apr-1940  Clinical Social Work is seeking post-discharge placement for this patient at the [place] level of care (*CSW will initial, date and re-position this form in  chart as items are completed):  Yes   Patient/family provided with [place] Work Department's list of facilities offering this level of care within the geographic area requested by the patient (or if unable, by the patient's family).  Yes   Patient/family informed of their freedom to choose among providers that offer the needed level of care, that participate in Medicare, Medicaid or managed care program needed by the patient, have an available bed and are willing to accept the patient.  Yes   Patient/family informed of [facility]'s ownership interest in [facility] and [facility], as well as of the fact that they are under no obligation to receive care at these facilities.  PASRR submitted to EDS on       PASRR number received on       Existing PASRR number confirmed on 12/19/16     FL2 transmitted to all facilities in geographic area requested by pt/family on 12/19/16     FL2 transmitted to all facilities within larger geographic area on       Patient informed that his/her managed care company has contracts with or will negotiate with certain facilities, including the following:            Patient/family informed of bed offers received.  Patient chooses bed at       Physician recommends and patient chooses bed at      Patient to be transferred to   on  .  Patient to be transferred to facility by       Patient family notified on   of transfer.  Name of family member notified:        PHYSICIAN       Additional Comment:    _______________________________________________ [name], Veronia Beets, LCSW 12/19/2016, 10:40 AM

## 2016-12-19 NOTE — Clinical Social Work Note (Signed)
Clinical Social Work Assessment  Patient Details  Name: Sara Greene MRN: [id number] Date of Birth: 20-Aug-1940  Date of referral:  12/19/16               Reason for consult:  Facility Placement                Permission sought to share information with:  Chartered certified accountant granted to share information::  Yes, Verbal Permission Granted  Name::      [place]::   [place]   Relationship::     Contact Information:     Housing/Transportation Living arrangements for the past 2 months:  [place] of Information:  Patient, Adult Children Patient Interpreter Needed:  None Criminal Activity/Legal Involvement Pertinent to Current Situation/Hospitalization:  No - Comment as needed Significant Relationships:  Adult Children, Spouse Lives with:  Spouse Do you feel safe going back to the place where you live?  Yes Need for family participation in patient care:  Yes (Comment)  Care giving concerns:  Patient lives in [place] with her husband Chrissie Noa.    Social Worker assessment / plan:  Holiday representative (CSW) received SNF consult. Patient will have surgery today for a hip fracture. CSW met with patient prior to surgery today and her daughter/ HPOA [name] [phone] was at bedside. Patient was alert and oriented X4 and was laying in the bed. CSW introduced self and explained role of CSW department. Patient reported that she lives in [place] with her husband. CSW explained that PT will evaluate patient after surgery and make a recommendation of home health or SNF. Patient is familiar with the SNF process and understands that [facility] will have to approve SNF. Patient is agreeable to SNF search in [place] and prefers [place] or [facility]. FL2 complete and faxed out. CSW will continue to follow and assist as needed.     Employment status:  Retired Nurse, adult PT Recommendations:  Not  assessed at this time Information / Referral to community resources:  [place]  Patient/Family's Response to care:  Patient is agreeable to SNF if needed.   Patient/Family's Understanding of and Emotional Response to Diagnosis, Current Treatment, and Prognosis:  Patient and her daughter were very pleasant and thanked CSW for assistance.   Emotional Assessment Appearance:  Appears stated age Attitude/Demeanor/Rapport:    Affect (typically observed):  Accepting, Adaptable, Pleasant Orientation:  Oriented to Self, Oriented to Place, Oriented to  Time, Oriented to Situation Alcohol / Substance use:  Not Applicable Psych involvement (Current and /or in the community):  No (Comment)  Discharge Needs  Concerns to be addressed:  Discharge Planning Concerns Readmission within the last 30 days:  No Current discharge risk:  Dependent with Mobility Barriers to Discharge:  Continued Medical Work up   [employer], Veronia Beets, LCSW 12/19/2016, 10:41 AM

## 2016-12-19 NOTE — Progress Notes (Signed)
Rept to Dr. Manuella Ghazi that pt is c/o "something crawling in her ears". Pt repts that her head feels congested. MD in to examine pt. No new orders at this time. Will continue to monitor.

## 2016-12-19 NOTE — Anesthesia Preprocedure Evaluation (Signed)
Anesthesia Evaluation  Patient identified by MRN, date of birth, ID band Patient awake    Reviewed: Allergy & Precautions, NPO status , Patient's Chart, lab work & pertinent test results  History of Anesthesia Complications Negative for: history of anesthetic complications  Airway Mallampati: II       Dental  (+) Upper Dentures, Lower Dentures   Pulmonary neg COPD,           Cardiovascular hypertension, Pt. on medications (-) Past MI and (-) CHF + dysrhythmias (hx of afib in SR since admissson) Atrial Fibrillation (-) Valvular Problems/Murmurs     Neuro/Psych neg Seizures Depression    GI/Hepatic Neg liver ROS, GERD  Medicated and Controlled,  Endo/Other  neg diabetesHypothyroidism   Renal/GU negative Renal ROS     Musculoskeletal   Abdominal   Peds  Hematology   Anesthesia Other Findings   Reproductive/Obstetrics                             Anesthesia Physical Anesthesia Plan  ASA: III and emergent  Anesthesia Plan: Spinal   Post-op Pain Management:    Induction:   PONV Risk Score and Plan:   Airway Management Planned:   Additional Equipment:   Intra-op Plan:   Post-operative Plan:   Informed Consent: I have reviewed the patients History and Physical, chart, labs and discussed the procedure including the risks, benefits and alternatives for the proposed anesthesia with the patient or authorized representative who has indicated his/her understanding and acceptance.     Plan Discussed with:   Anesthesia Plan Comments:         Anesthesia Quick Evaluation

## 2016-12-19 NOTE — Anesthesia Procedure Notes (Signed)
Spinal  Start time: 12/19/2016 9:18 PM End time: 12/19/2016 9:32 PM Staffing Performed: anesthesiologist  Preanesthetic Checklist Completed: patient identified, site marked, surgical consent, pre-op evaluation, timeout performed, IV checked, risks and benefits discussed and monitors and equipment checked Spinal Block Patient position: left lateral decubitus Prep: ChloraPrep Patient monitoring: heart rate, cardiac monitor, continuous pulse ox and blood pressure Approach: midline Location: L4-5 Injection technique: single-shot Needle Needle type: Pencan  Needle gauge: 24 G Needle length: 9 cm Needle insertion depth: 7.5 cm

## 2016-12-19 NOTE — Progress Notes (Signed)
Rept to [employer] in [place]. Per her rept, they will be to get patient for transport to OR shortly. Will rept to oncoming RN

## 2016-12-20 ENCOUNTER — Encounter: Payer: Self-pay | Admitting: Orthopedic Surgery

## 2016-12-20 LAB — CBC
HCT: 25.4 % — ABNORMAL LOW (ref 35.0–47.0)
HEMATOCRIT: 23.1 % — AB (ref 35.0–47.0)
HEMOGLOBIN: 8.5 g/dL — AB (ref 12.0–16.0)
Hemoglobin: 7.9 g/dL — ABNORMAL LOW (ref 12.0–16.0)
MCH: 29.7 pg (ref 26.0–34.0)
MCH: 29.9 pg (ref 26.0–34.0)
MCHC: 33.5 g/dL (ref 32.0–36.0)
MCHC: 34.1 g/dL (ref 32.0–36.0)
MCV: 88.6 fL (ref 80.0–100.0)
MCV: 87.9 fL (ref 80.0–100.0)
PLATELETS: 211 10*3/uL (ref 150–440)
PLATELETS: 182 10*3/uL (ref 150–440)
RBC: 2.86 MIL/uL — ABNORMAL LOW (ref 3.80–5.20)
RBC: 2.63 MIL/uL — ABNORMAL LOW (ref 3.80–5.20)
RDW: 13.3 % (ref 11.5–14.5)
RDW: 13.4 % (ref 11.5–14.5)
WBC: 14.6 10*3/uL — ABNORMAL HIGH (ref 3.6–11.0)
WBC: 12.5 10*3/uL — ABNORMAL HIGH (ref 3.6–11.0)

## 2016-12-20 LAB — BASIC METABOLIC PANEL
Anion gap: 8 (ref 5–15)
Anion gap: 7 (ref 5–15)
BUN: 14 mg/dL (ref 6–20)
BUN: 14 mg/dL (ref 6–20)
CALCIUM: 7.6 mg/dL — AB (ref 8.9–10.3)
CALCIUM: 7.7 mg/dL — AB (ref 8.9–10.3)
CO2: 24 mmol/L (ref 22–32)
CO2: 24 mmol/L (ref 22–32)
CREATININE: 1.1 mg/dL — AB (ref 0.44–1.00)
CREATININE: 1.05 mg/dL — AB (ref 0.44–1.00)
Chloride: 100 mmol/L — ABNORMAL LOW (ref 101–111)
Chloride: 101 mmol/L (ref 101–111)
GFR calc Af Amer: 55 mL/min — ABNORMAL LOW (ref >60)
GFR calc non Af Amer: 47 mL/min — ABNORMAL LOW (ref >60)
GFR, EST AFRICAN AMERICAN: 58 mL/min — AB (ref >60)
GFR, EST NON AFRICAN AMERICAN: 50 mL/min — AB (ref >60)
GLUCOSE: 154 mg/dL — AB (ref 65–99)
GLUCOSE: 140 mg/dL — AB (ref 65–99)
Potassium: 4.1 mmol/L (ref 3.5–5.1)
Potassium: 4.7 mmol/L (ref 3.5–5.1)
SODIUM: 132 mmol/L — AB (ref 135–145)
Sodium: 132 mmol/L — ABNORMAL LOW (ref 135–145)

## 2016-12-20 MED ORDER — METOCLOPRAMIDE HCL 5 MG/ML IJ SOLN: 5.0000 mg | Freq: Three times a day (TID) | INTRAMUSCULAR | Status: DC | PRN

## 2016-12-20 MED ORDER — ENOXAPARIN SODIUM 40 MG/0.4ML ~~LOC~~ SOLN
40.0000 mg | SUBCUTANEOUS | Status: DC
  Administered 2016-12-20: 40 mg via SUBCUTANEOUS
  Filled 2016-12-20: qty 0.4

## 2016-12-20 MED ORDER — LETROZOLE 2.5 MG PO TABS
2.5000 mg | ORAL_TABLET | Freq: Every day | ORAL | Status: DC
  Administered 2016-12-20 – 2016-12-21 (×2): 2.5 mg via ORAL
  Filled 2016-12-20 (×3): qty 1

## 2016-12-20 MED ORDER — ACETAMINOPHEN 500 MG PO TABS
1000.0000 mg | ORAL_TABLET | Freq: Three times a day (TID) | ORAL | Status: AC
  Administered 2016-12-20 (×2): 1000 mg via ORAL
  Filled 2016-12-20 (×3): qty 2

## 2016-12-20 MED ORDER — CEFAZOLIN SODIUM-DEXTROSE 2-4 GM/100ML-% IV SOLN
2.0000 g | Freq: Four times a day (QID) | INTRAVENOUS | Status: AC
  Administered 2016-12-20 (×2): 2 g via INTRAVENOUS
  Filled 2016-12-20 (×4): qty 100

## 2016-12-20 MED ORDER — BISACODYL 10 MG RE SUPP
10.0000 mg | Freq: Every day | RECTAL | Status: DC | PRN
  Administered 2016-12-21 – 2016-12-22 (×2): 10 mg via RECTAL
  Filled 2016-12-20 (×2): qty 1

## 2016-12-20 MED ORDER — OXYCODONE HCL 5 MG PO TABS
5.0000 mg | ORAL_TABLET | ORAL | Status: DC | PRN
  Administered 2016-12-20 – 2016-12-22 (×8): 10 mg via ORAL
  Administered 2016-12-22: 5 mg via ORAL
  Filled 2016-12-20: qty 2
  Filled 2016-12-20: qty 1
  Filled 2016-12-20 (×6): qty 2
  Filled 2016-12-20: qty 1
  Filled 2016-12-20: qty 2

## 2016-12-20 MED ORDER — SENNOSIDES-DOCUSATE SODIUM 8.6-50 MG PO TABS: 1.0000 | ORAL_TABLET | Freq: Every evening | ORAL | Status: DC | PRN

## 2016-12-20 MED ORDER — ONDANSETRON HCL 4 MG/2ML IJ SOLN: 4.0000 mg | Freq: Four times a day (QID) | INTRAMUSCULAR | Status: DC | PRN

## 2016-12-20 MED ORDER — METOCLOPRAMIDE HCL 10 MG PO TABS: 5.0000 mg | ORAL_TABLET | Freq: Three times a day (TID) | ORAL | Status: DC | PRN

## 2016-12-20 MED ORDER — MENTHOL 3 MG MT LOZG
1.0000 | LOZENGE | OROMUCOSAL | Status: DC | PRN
  Filled 2016-12-20: qty 9

## 2016-12-20 MED ORDER — FENTANYL CITRATE (PF) 100 MCG/2ML IJ SOLN
INTRAMUSCULAR | Status: AC
  Filled 2016-12-20: qty 2

## 2016-12-20 MED ORDER — CEFAZOLIN SODIUM 1 G IJ SOLR: 2.0000 g | Freq: Once | INTRAMUSCULAR | Status: DC

## 2016-12-20 MED ORDER — PHENOL 1.4 % MT LIQD
1.0000 | OROMUCOSAL | Status: DC | PRN
  Filled 2016-12-20: qty 177

## 2016-12-20 MED ORDER — ONDANSETRON HCL 4 MG PO TABS: 4.0000 mg | ORAL_TABLET | Freq: Four times a day (QID) | ORAL | Status: DC | PRN

## 2016-12-20 MED ORDER — DOCUSATE SODIUM 100 MG PO CAPS: 100.0000 mg | ORAL_CAPSULE | Freq: Two times a day (BID) | ORAL | Status: DC

## 2016-12-20 MED ORDER — HYDROMORPHONE HCL 1 MG/ML IJ SOLN
0.4000 mg | INTRAMUSCULAR | Status: DC | PRN
  Administered 2016-12-21 – 2016-12-22 (×2): 0.4 mg via INTRAVENOUS
  Filled 2016-12-20 (×2): qty 1

## 2016-12-20 MED ORDER — FE FUMARATE-B12-VIT C-FA-IFC PO CAPS
1.0000 | ORAL_CAPSULE | Freq: Two times a day (BID) | ORAL | Status: DC
  Administered 2016-12-20 – 2016-12-22 (×5): 1 via ORAL
  Filled 2016-12-20 (×6): qty 1

## 2016-12-20 MED ORDER — ACETAMINOPHEN 325 MG PO TABS: 650.0000 mg | ORAL_TABLET | Freq: Four times a day (QID) | ORAL | Status: DC | PRN

## 2016-12-20 MED ORDER — NEOMYCIN-POLYMYXIN B GU 40-200000 IR SOLN
Status: AC
  Filled 2016-12-20: qty 4

## 2016-12-20 MED ORDER — ACETAMINOPHEN 650 MG RE SUPP: 650.0000 mg | Freq: Four times a day (QID) | RECTAL | Status: DC | PRN

## 2016-12-20 MED ORDER — FENTANYL CITRATE (PF) 100 MCG/2ML IJ SOLN: 25.0000 ug | INTRAMUSCULAR | Status: DC | PRN

## 2016-12-20 MED ORDER — DIPHENHYDRAMINE HCL 12.5 MG/5ML PO ELIX: 12.5000 mg | ORAL_SOLUTION | ORAL | Status: DC | PRN

## 2016-12-20 MED ORDER — ONDANSETRON HCL 4 MG/2ML IJ SOLN: 4.0000 mg | Freq: Once | INTRAMUSCULAR | Status: DC | PRN

## 2016-12-20 MED ORDER — SODIUM CHLORIDE 0.9 % IV SOLN
INTRAVENOUS | Status: DC
  Administered 2016-12-20 (×3): via INTRAVENOUS

## 2016-12-20 NOTE — Anesthesia Postprocedure Evaluation (Signed)
Anesthesia Post Note  Patient: Sales promotion account executive  Procedure(s) Performed: INTRAMEDULLARY (IM) NAIL INTERTROCHANTRIC (Right Hip)  Patient location during evaluation: Nursing Unit Anesthesia Type: Spinal Level of consciousness: awake, awake and alert and oriented Pain management: pain level controlled Vital Signs Assessment: post-procedure vital signs reviewed and stable Respiratory status: spontaneous breathing Cardiovascular status: blood pressure returned to baseline Postop Assessment: no headache, no backache, no apparent nausea or vomiting and adequate PO intake Anesthetic complications: no     Last Vitals:  Vitals:   12/20/16 0702 12/20/16 0801  BP: (!) 104/51 (!) 114/50  Pulse: 93 88  Resp:    Temp:    SpO2: 100% 99%    Last Pain:  Vitals:   12/20/16 0529  TempSrc:   PainSc: 7                  Hamsini Verrilli

## 2016-12-20 NOTE — Brief Op Note (Addendum)
12/18/2016 - 12/20/2016  1:13 AM  PATIENT:  Sara Greene  76 y.o. female  PRE-OPERATIVE DIAGNOSIS:  fractured right hip  POST-OPERATIVE DIAGNOSIS:  fractured right hip  PROCEDURE:  Procedure(s): INTRAMEDULLARY (IM) NAIL INTERTROCHANTRIC (Right)  SURGEON:  Surgeon(s) and Role:    Leim Fabry, MD - Primary  ANESTHESIA:   spinal  EBL:  Total I/O In: 2400 [I.V.:2400] Out: 1150 [Urine:450; Blood:700]  BLOOD ADMINISTERED:none  DRAINS: none   LOCAL MEDICATIONS USED:  NONE  SPECIMEN:  No Specimen  DISPOSITION OF SPECIMEN:  N/A  COUNTS:  YES  TOURNIQUET:  * No tourniquets in log *  DICTATION: .Note written in EPIC  PLAN OF CARE: Admit to inpatient   PATIENT DISPOSITION:  PACU - hemodynamically stable.   Delay start of Pharmacological VTE agent (>24hrs) due to surgical blood loss or risk of bleeding: no  POSTOP PLAN: - PT/OT on POD#1 - FFWB on operative extremity - Ancef q8h x 24 hours - DVT ppx: Lovenox 40mg /day starting on POD#1  - Pain: continue home narcotics, add additional oxycodone as needed, can give dilaudid iv for breakthrough

## 2016-12-20 NOTE — Progress Notes (Signed)
PT is recommending SNF. Clinical Social Worker (CSW) presented bed offers to patient and her daughter [name] and they chose [facility]. CSW started [facility] Medicare SNF auth through [place] [facility]. [facility] liaison is aware of above.   [employer], LCSW [phone]

## 2016-12-20 NOTE — Anesthesia Post-op Follow-up Note (Signed)
Anesthesia QCDR form completed.        

## 2016-12-20 NOTE — Progress Notes (Signed)
   Subjective: 1 Day Post-Op Procedure(s) (LRB): INTRAMEDULLARY (IM) NAIL INTERTROCHANTRIC (Right)  Right distal radius fracture Patient reports pain as 7 on 0-10 scale. Right wrist pain greater than right hip surgical pain. Patient is well, and has had no acute complaints or problems Denies any dizziness, light headedness, CP, SOB, ABD pain. We will start therapy today.  .  Objective: Vital signs in last 24 hours: Temp:  [96.7 F (35.9 C)-99.1 F (37.3 C)] 97.8 F (36.6 C) (10/03 0216) Pulse Rate:  [72-97] 88 (10/03 0801) Resp:  [11-19] 19 (10/03 0254) BP: (104-164)/(43-88) 114/50 (10/03 0801) SpO2:  [98 %-100 %] 99 % (10/03 0801)  Intake/Output from previous day: 10/02 0701 - 10/03 0700 In: 3593.8 [I.V.:3593.8] Out: 2025 [Urine:1325; Blood:700] Intake/Output this shift: No intake/output data recorded.   Recent Labs  12/18/16 1819 12/20/16 0125 12/20/16 0726  HGB 12.4 8.5* 7.9*    Recent Labs  12/20/16 0125 12/20/16 0726  WBC 14.6* 12.5*  RBC 2.86* 2.63*  HCT 25.4* 23.1*  PLT 211 182    Recent Labs  12/20/16 0125 12/20/16 0726  NA 132* 132*  K 4.1 4.7  CL 100* 101  CO2 24 24  BUN 14 14  CREATININE 1.10* 1.05*  GLUCOSE 154* 140*  CALCIUM 7.6* 7.7*    Recent Labs  12/19/16 0937  INR 1.07    EXAM General - Patient is Alert, Appropriate and Oriented Right lower Extremity - Neurovascular intact Sensation intact distally Intact pulses distally Dorsiflexion/Plantar flexion intact No cellulitis present Compartment soft  Dressing clean dry and intact Right Upper Extremity - Neurovascular intact Sensation intact distally Mild swelling through out digits Splint intact Dressing - dressing C/D/I and no drainage Motor Function - intact, moving foot and toes well on exam.   Past Medical History:  Diagnosis Date  . Arthritis   . Atrial fibrillation ([place])   . Chronic pain   . Chronic urethral narrowing    please use the smallest foley catheter  available for her  . Depression   . Dysrhythmia    a fib. per patient, dr. lithicum could not confirm this diagnosis  . GERD (gastroesophageal reflux disease)   . Hyperlipidemia   . Hypertension   . Hypokalemia   . Hyponatremia   . Hypothyroidism   . Osteoporosis     Assessment/Plan:   1 Day Post-Op Procedure(s) (LRB): INTRAMEDULLARY (IM) NAIL INTERTROCHANTRIC (Right) Active Problems:   Closed right hip fracture (HCC)   Right wrist fracture   Acute post op blood loss anemia with underlying chronic anemia  Estimated body mass index is 32.8 kg/m as calculated from the following:   Height as of this encounter: 5\' 4"  (1.626 m).   Weight as of this encounter: 86.7 kg (191 lb 1.6 oz). Advance diet Up with therapy, Touch down weight bearing RLE Recommend platform walker for right wrist fracture  Needs BM  Acute post op blood loss anemia with underlying chronic anemia - Recheck labs in the am. Start Iron supplement. Patient asymptomatic  Recheck labs in the am, may need transfusion if Hgb tends down.  DVT Prophylaxis - Lovenox, Foot Pumps and TED hose   T. Rachelle Hora, PA-C [place] 12/20/2016, 8:38 AM

## 2016-12-20 NOTE — Evaluation (Signed)
Occupational Therapy Evaluation Patient Details Name: Sara Greene MRN: [id number] DOB: [birth year] Today's Date: 12/20/2016    History of Present Illness Pt admitted for closed R hip fx, s/p IM nailing on 10/2, and R distal radius fx from fall on 10/1. PMH of R wrist fx, chronic afib, arthritis, chronic pain, HTN, depression.    Clinical Impression   Pt is 76 year old female s/p R hip IM nail, POD#1 for OT evaluation. Pt lives at home with her husband and adult children who are very involved and supportive in pt's care/recovery. 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 Pt was independent in all ADLs prior to surgery and is eager to return to PLOF.  Pt is currently limited in functional ADLs due to pain, decreased ROM and strength, nausea and fatigue. Pt with hemoglobin of 7.9, has not eaten very much in past couple days due to surgery times changing, per pt report. Reasonable to anticipate that pt will improve well once able to eat and with improvement in hemoglobin. Pt currently requires mod-maximum assist for LB dressing and bathing skills due to pain and decreased AROM of RLE and NWB'ing status of R hand (dominant hand). Daughter verbalizes confidence in ability to provide needed level of assist at home, should the pt need it. Pt/daughter educated in [place] for LB dressing and 1 handed techniques for bathing/dressing with education/problem solving in work simplification and home/routines modifications to maximize functional independence and safety. Pt will benefit from continued skilled OT services for education in assistive devices, functional mobility, and education in recommendations for home modifications to increase safety and prevent falls. Recommend STR based on today's assessment.  Pt with excellent support system and home set up.  Will continue to assess next date for appropriateness of [facility] services following hospitalization.    Follow Up Recommendations  SNF    Equipment Recommendations  3 in 1  bedside commode    Recommendations for Other Services       Precautions / Restrictions Precautions Precautions: Fall;Other (comment) (R hip, R wrist) Restrictions Weight Bearing Restrictions: Yes RUE Weight Bearing: Non weight bearing RLE Weight Bearing: Touchdown weight bearing (TTWB'ing RLE)      Mobility Bed Mobility Overal bed mobility: Needs Assistance     General bed mobility comments: max assist x2 to scoot up in bed to provide optimal positioning for comfort and skin protection  Transfers                 General transfer comment: Deferred, pt appeared very fatigued, pt reported not being able to maintain seated position to perform transfers.     Balance                                  ADL either performed or assessed with clinical judgement   ADL Overall ADL's : Needs assistance/impaired Eating/Feeding: Sitting;Set up   Grooming: Sitting;Set up   Upper Body Bathing: Minimal assistance;With caregiver independent assisting   Lower Body Bathing: Sitting/lateral leans;Moderate assistance;With caregiver independent assisting   Upper Body Dressing : Sitting;Set up;Minimal assistance;With caregiver independent assisting   Lower Body Dressing: Sitting/lateral leans;Moderate assistance;Maximal assistance Lower Body Dressing Details (indicate cue type and reason): pt/daughter educated in use of reacher that pt has at home for LB dressing tasks to maximize safety, adhere to precautions, and maximize functional independence via verbal instruction and visual demonstration.   Toilet Transfer Details (indicate cue type and  reason): deferred due to pt's pain/fatigue           General ADL Comments: very supportive family and home set up, daughter verbalizes confidence in providing needed level of assist for ADL tasks     Vision Baseline Vision/History: Wears glasses Wears Glasses: At all times Patient Visual Report: No change from baseline Vision  Assessment?: No apparent visual deficits     Perception     Praxis      Pertinent Vitals/Pain Pain Assessment: 0-10 Pain Score: 8  Pain Location: R hip and R wrist Pain Intervention(s): Limited activity within patient's tolerance;Monitored during session;Repositioned     Hand Dominance Right   Extremity/Trunk Assessment Upper Extremity Assessment Upper Extremity Assessment: Generalized weakness (grossly 4/5 bilaterally, unable to fully assess due to R wrist fracture)   Lower Extremity Assessment Lower Extremity Assessment: Generalized weakness (grossly 4/5 bilaterally, unable to fully assess due to R hip sx)   Cervical / Trunk Assessment Cervical / Trunk Assessment: Normal   Communication Communication Communication: No difficulties   Cognition Arousal/Alertness: Awake/alert Behavior During Therapy: WFL for tasks assessed/performed Overall Cognitive Status: Within Functional Limits for tasks assessed                                     General Comments       Exercises Exercises: Other exercises Other Exercises Other Exercises: pt/daughter educated in home/routines modifications, AE/DME, work simplification, and falls prevention strategies; pt and daughter verbalized understanding.   Shoulder Instructions      Home Living Family/patient expects to be discharged to:: Private residence Living Arrangements: Spouse/significant other Available Help at Discharge: Family;Available 24 hours/day (spouse 24/7, adult children nearby and very actively supportive/available to assist) Type of Home: House Home Access: Level entry     Home Layout: One level     Bathroom Shower/Tub: Tub/shower unit;Walk-in shower   Bathroom Toilet: Standard     Home Equipment: Environmental consultant - 2 wheels;Walker - 4 wheels;Tub bench;Toilet riser;Bedside commode;Hand held shower head;Adaptive equipment Adaptive Equipment: Reacher;Sock aid Additional Comments: Pt has RW and rollator from  prior TKA, does not use either      Prior Functioning/Environment Level of Independence: Independent        Comments: Pt able to perform ADLs independently with no assistive device.         OT Problem List: Decreased strength;Decreased range of motion;Pain;Decreased activity tolerance;Decreased knowledge of use of DME or AE;Decreased knowledge of precautions;Impaired UE functional use      OT Treatment/Interventions: Therapeutic exercise;Self-care/ADL training;Therapeutic activities;DME and/or AE instruction;Patient/family education    OT Goals(Current goals can be found in the care plan section) Acute Rehab OT Goals Patient Stated Goal: to return home OT Goal Formulation: With patient/family Time For Goal Achievement: 01/03/17 Potential to Achieve Goals: Good  OT Frequency: Min 1X/week   Barriers to D/C:            Co-evaluation              AM-PAC PT "6 Clicks" Daily Activity     Outcome Measure Help from another person eating meals?: None Help from another person taking care of personal grooming?: None Help from another person toileting, which includes using toliet, bedpan, or urinal?: A Lot Help from another person bathing (including washing, rinsing, drying)?: A Lot Help from another person to put on and taking off regular upper body clothing?: A Little Help from another person  to put on and taking off regular lower body clothing?: A Lot 6 Click Score: 17   End of Session    Activity Tolerance: Patient tolerated treatment well Patient left: in bed;with call bell/phone within reach;with bed alarm set;with family/visitor present  OT Visit Diagnosis: Other abnormalities of gait and mobility (R26.89)                Time: 2423-5361 OT Time Calculation (min): 37 min Charges:  OT General Charges $OT Visit: 1 Visit OT Evaluation $OT Eval Low Complexity: 1 Low OT Treatments $Self Care/Home Management : 23-37 mins G-Codes: OT G-codes **NOT FOR INPATIENT  CLASS** Functional Assessment Tool Used: AM-PAC 6 Clicks Daily Activity;Clinical judgement Functional Limitation: Self care Self Care Current Status (W4315): At least 40 percent but less than 60 percent impaired, limited or restricted Self Care Goal Status (Q0086): At least 1 percent but less than 20 percent impaired, limited or restricted   Jeni Salles, MPH, MS, OTR/L ascom [phone] 12/20/16, 4:28 PM

## 2016-12-20 NOTE — Transfer of Care (Signed)
Immediate Anesthesia Transfer of Care Note  Patient: Sara Greene  Procedure(s) Performed: INTRAMEDULLARY (IM) NAIL INTERTROCHANTRIC (Right Hip)  Patient Location: PACU  Anesthesia Type:General  Level of Consciousness: awake, alert , oriented and patient cooperative  Airway & Oxygen Therapy: Patient Spontanous Breathing  Post-op Assessment: Report given to RN and Post -op Vital signs reviewed and stable  Post vital signs: Reviewed and stable  Last Vitals:  Vitals:   12/19/16 0740 12/19/16 1526  BP: (!) 142/55 (!) 164/50  Pulse: 74 72  Resp: 16   Temp: 37.1 C 37.3 C  SpO2: 96% 100%    Last Pain:  Vitals:   12/19/16 1844  TempSrc:   PainSc: 5       Patients Stated Pain Goal: 5 (11/73/56 7014)  Complications: No apparent anesthesia complications

## 2016-12-20 NOTE — Progress Notes (Signed)
Physical Therapy Treatment Patient Details Name: Sara Greene MRN: [id number] DOB: [birth year] Today's Date: 12/20/2016    History of Present Illness Pt admitted for closed R hip fx, s/p IM nailing on 10/2, and R distal radius fx from fall on 10/1. PMH of R wrist fx, chronic afib, arthritis, chronic pain, HTN, depression.     PT Comments    Pt is making progress towards goals. Pt performed supine there-ex, reviewed written HEP. Pt performed bed mobility with max assist, however able to maintain seated position at EOB for several minutes. Pt appeared very fatigued and nauseas, reported she did not want to stand or walk today. Encouraged pt to perform pursed lip breathing. Pt appears motivated to participate in PT activities, however is limited by fatigue and feeling unwell. Pt removed from 3L oxygen and placed on room air (baseline) throughout session, monitored O2 sats, 87-95%. Confirmed WB status with MD, TTWB for R hip, and NWB for R wrist. Will continue to progress.   Follow Up Recommendations  SNF     Equipment Recommendations  None recommended by PT    Recommendations for Other Services       Precautions / Restrictions Precautions Precautions: Fall Restrictions Weight Bearing Restrictions: Yes RUE Weight Bearing: Non weight bearing RLE Weight Bearing: Touchdown weight bearing (FFWB)    Mobility  Bed Mobility Overal bed mobility: Needs Assistance Bed Mobility: Supine to Sit     Supine to sit: Max assist     General bed mobility comments: Pt requires cues for proper sequencing and technique, requires max assist to guide LEs off bed and help lift trunk off bed and into seated positon at EOB. +2 assist to scoot pt up in bed for optimal positioning  Transfers                 General transfer comment: Deferred, pt appeared very fatigued and nauseas. Pt stated she did not want to stand up or amb to the recliner today but will try tomorrow.   Ambulation/Gait              General Gait Details: Deferred, pt appeared very fatigued and nauseas. Pt stated she did not want to stand up or amb to the recliner today but will try tomorrow.    Stairs            Wheelchair Mobility    Modified Rankin (Stroke Patients Only)       Balance Overall balance assessment: Needs assistance Sitting-balance support: Feet supported Sitting balance-Leahy Scale: Good Sitting balance - Comments: Pt able to get into seated position at EOB, and maintain position for several minutes. Pt cued to do pursed lip breathing.        Standing balance comment: Deferred                            Cognition Arousal/Alertness: Awake/alert Behavior During Therapy: WFL for tasks assessed/performed Overall Cognitive Status: Within Functional Limits for tasks assessed                                        Exercises Other Exercises Other Exercises: Supine ther-ex, both sides 12x, ankle pumps, glute sets, hip abd/add, SAQs. Pt cued for proper technique, required min assist to initiate RLE exercises.    General Comments        Pertinent Vitals/Pain Pain  Assessment: 0-10 Pain Score: 7  Pain Location: R hip and R wrist Pain Intervention(s): Limited activity within patient's tolerance;Monitored during session;Repositioned    Home Living Family/patient expects to be discharged to:: Private residence Living Arrangements: Spouse/significant other Available Help at Discharge: Family;Available 24 hours/day (spouse 24/7, adult children nearby and very actively supportive/available to assist) Type of Home: House Home Access: Level entry   Home Layout: One level Home Equipment: Walker - 2 wheels;Walker - 4 wheels;Tub bench;Toilet riser;Bedside commode;Hand held shower head;Adaptive equipment      Prior Function Level of Independence: Independent      Comments: Pt able to perform ADLs independently with no assistive device.    PT Goals  (current goals can now be found in the care plan section) Acute Rehab PT Goals Patient Stated Goal: to return home PT Goal Formulation: With patient Time For Goal Achievement: 01/03/17 Potential to Achieve Goals: Good Progress towards PT goals: Progressing toward goals    Frequency    BID      PT Plan Current plan remains appropriate    Co-evaluation              AM-PAC PT "6 Clicks" Daily Activity  Outcome Measure  Difficulty turning over in bed (including adjusting bedclothes, sheets and blankets)?: Unable Difficulty moving from lying on back to sitting on the side of the bed? : Unable Difficulty sitting down on and standing up from a chair with arms (e.g., wheelchair, bedside commode, etc,.)?: Unable Help needed moving to and from a bed to chair (including a wheelchair)?: A Lot Help needed walking in hospital room?: A Little Help needed climbing 3-5 steps with a railing? : A Lot 6 Click Score: 10    End of Session Equipment Utilized During Treatment: Gait belt Activity Tolerance: Patient limited by fatigue;Patient limited by pain Patient left: in bed;with call bell/phone within reach;with bed alarm set   PT Visit Diagnosis: Other abnormalities of gait and mobility (R26.89);Muscle weakness (generalized) (M62.81);Pain Pain - Right/Left: Right Pain - part of body: Hip     Time: 4431-5400 PT Time Calculation (min) (ACUTE ONLY): 25 min  Charges:  $Therapeutic Exercise: 8-22 mins $Therapeutic Activity: 8-22 mins                    G Codes:  Functional Assessment Tool Used: AM-PAC 6 Clicks Basic Mobility Functional Limitation: Mobility: Walking and moving around Mobility: Walking and Moving Around Current Status (Q6761): At least 60 percent but less than 80 percent impaired, limited or restricted Mobility: Walking and Moving Around Goal Status [phone]): At least 40 percent but less than 60 percent impaired, limited or restricted    Manfred Arch, SPT   Manfred Arch 12/20/2016, 5:42 PM

## 2016-12-20 NOTE — Progress Notes (Deleted)
Called report to Levada Dy, LPN at [facility]. Answered all questions. EMS called for transport. Belongings packed

## 2016-12-20 NOTE — Progress Notes (Signed)
S/p R cephalomedullary nailing of hip. 700 EBL  POSTOP PLAN: - PT/OT on POD#1 - FFWB on operative extremity - Ancef q8h x 24 hours - DVT ppx: Lovenox 40mg /day starting on POD#1  - Pain: continue home narcotics, add additional oxycodone as needed, can give dilaudid iv for breakthrough - CBC/BMP in AM - Remove Foley on POD#1

## 2016-12-20 NOTE — Progress Notes (Signed)
Physical Therapy Evaluation Patient Details Name: Sara Greene MRN: [id number] DOB: [birth year] Today's Date: 12/20/2016   History of Present Illness  Pt admitted for closed R hip fx, s/p IM nailing on 10/2, and R distal radius fx from fall on 10/1. PMH of R wrist fx, chronic afib, arthritis, chronic pain, HTN, depression.   Clinical Impression  Pt is a pleasant 75 year old female admitted for closed R hip fx. Pt performs supine there-ex, performs bed mobility with max assist, unable to perform transfers/amb due to inability to maintain seated position due to increased fatigue and hip pain. Pt was on 2L of oxygen, however is not on oxygen at baseline. Monitored pt on room air, 95-99% throughout session. Pt appeared very fatigued throughout session. Attempted to contact MD to clarify R hip and R wrist precautions/weight-bearing status prior to starting session. Unable to reach, followed chart notes, TDWB and FFWB of R hip, with recommendations for platform walker. Pt educated on weight-bearing status of R hip and R wrist. Pt demonstrates deficits with strength, endurance, bed mobility, transfers, and amb. Would benefit from skilled PT to address above deficits and promote optimal return to home. Recommend transition to SNF upon DC from acute hospitalization.     Follow Up Recommendations SNF    Equipment Recommendations  None recommended by PT    Recommendations for Other Services       Precautions / Restrictions Precautions Precautions: Fall;Other (comment) (R hip, R wrist) Restrictions Weight Bearing Restrictions: Yes RUE Weight Bearing: Non weight bearing RLE Weight Bearing: Touchdown weight bearing (FFWB, per pt's chart review)      Mobility  Bed Mobility Overal bed mobility: Needs Assistance Bed Mobility: Supine to Sit     Supine to sit: Max assist (Daughter was present in room, helped provide +2 assist )     General bed mobility comments: Pt required cues for proper sequencing  and positioning of UEs/LEs. Provided assistance to guide LEs off of bed. Pt required assistance to lift trunk off of bed and pull up into seated position. Pt had difficulty due to inability to use RUE to use hand rail. Pt unable to maintain seated position at EOB due to R hip pain.  Transfers                 General transfer comment: Deferred, pt appeared very fatigued, pt reported not being able to maintain seated position to perform transfers.   Ambulation/Gait             General Gait Details: Deferred, pt appeared too fatigued to perform safely.   Stairs            Wheelchair Mobility    Modified Rankin (Stroke Patients Only)       Balance Overall balance assessment: Needs assistance Sitting-balance support: Feet supported Sitting balance-Leahy Scale: Fair Sitting balance - Comments: Pt able to get into seated position at EOB, however due to R hip pain and decreased trunk control, pt unable to maintain position.        Standing balance comment: Deferred                             Pertinent Vitals/Pain Pain Assessment: 0-10 Pain Score: 7  Pain Location: R hip and R wrist Pain Intervention(s): Limited activity within patient's tolerance;Monitored during session;Repositioned    Home Living Family/patient expects to be discharged to:: Private residence Living Arrangements: Spouse/significant other Available Help at Discharge:  Family;Available 24 hours/day (husband ) Type of Home: House Home Access: Level entry     Home Layout: One level Home Equipment: Walker - 2 wheels;Walker - 4 wheels Additional Comments: 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      Hand Dominance        Extremity/Trunk Assessment   Upper Extremity Assessment Upper Extremity Assessment: Generalized  weakness (LUE MMT grossly 4/5)    Lower Extremity Assessment Lower Extremity Assessment: Generalized weakness (LLE MMT grossly 4/5)       Communication   Communication: No difficulties  Cognition Arousal/Alertness: Lethargic;Awake/alert Behavior During Therapy: WFL for tasks assessed/performed Overall Cognitive Status: Within Functional Limits for tasks assessed                                        General Comments      Exercises Other Exercises Other Exercises: Supine ther-ex 10x both sides, ankle pumps, SLRs, hip abduction (no adduction past neutral). Pt required cues for proper technique, min assist to initiate ther-ex on RLE.    Assessment/Plan    PT Assessment Patient needs continued PT services  PT Problem List Decreased strength;Decreased range of motion;Decreased activity tolerance;Decreased balance;Decreased mobility;Decreased knowledge of use of DME;Decreased knowledge of precautions;Pain       PT Treatment Interventions DME instruction;Gait training;Functional mobility training;Therapeutic activities;Therapeutic exercise;Patient/family education    PT Goals (Current goals can be found in the Care Plan section)  Acute Rehab PT Goals Patient Stated Goal: to return home PT Goal Formulation: With patient Time For Goal Achievement: 01/03/17 Potential to Achieve Goals: Good    Frequency BID   Barriers to discharge        Co-evaluation               AM-PAC PT "6 Clicks" Daily Activity  Outcome Measure Difficulty turning over in bed (including adjusting bedclothes, sheets and blankets)?: Unable Difficulty moving from lying on back to sitting on the side of the bed? : Unable Difficulty sitting down on and standing up from a chair with arms (e.g., wheelchair, bedside commode, etc,.)?: Unable Help needed moving to and from a bed to chair (including a wheelchair)?: A Little Help needed walking in hospital room?: A Lot Help needed climbing  3-5 steps with a railing? : A Lot 6 Click Score: 10    End of Session Equipment Utilized During Treatment: Gait belt Activity Tolerance: Patient limited by fatigue;Patient limited by lethargy Patient left: in bed;with call bell/phone within reach;with bed alarm set;with family/visitor present Nurse Communication: Mobility status PT Visit Diagnosis: Other abnormalities of gait and mobility (R26.89);Muscle weakness (generalized) (M62.81);Pain Pain - Right/Left: Right Pain - part of body: Hip (wrist)    Time: 8299-3716 PT Time Calculation (min) (ACUTE ONLY): 35 min   Charges:         PT G Codes:   PT G-Codes **NOT FOR INPATIENT CLASS** Functional Assessment Tool Used: AM-PAC 6 Clicks Basic Mobility Functional Limitation: Mobility: Walking and moving around Mobility: Walking and Moving Around Current Status (R6789): At least 60 percent but less than 80 percent impaired, limited or restricted Mobility: Walking and Moving Around Goal Status [id number]): At least 40 percent but less than 60 percent impaired,  limited or restricted    Manfred Arch, SPT  Manfred Arch 12/20/2016, 3:45 PM

## 2016-12-20 NOTE — Progress Notes (Signed)
[facility] [place] NAME: Sara Greene    MR#:  [id number]  DATE OF BIRTH:  [birth year]  SUBJECTIVE:  CHIEF COMPLAINT:   Chief Complaint  Patient presents with  . Fall  . Hip Pain  pain better controlled REVIEW OF SYSTEMS:  Review of Systems  Constitutional: Positive for malaise/fatigue. Negative for chills, fever and weight loss.  HENT: Negative for nosebleeds and sore throat.   Eyes: Negative for blurred vision.  Respiratory: Negative for cough, shortness of breath and wheezing.   Cardiovascular: Negative for chest pain, orthopnea, leg swelling and PND.  Gastrointestinal: Negative for abdominal pain, constipation, diarrhea, heartburn, nausea and vomiting.  Genitourinary: Negative for dysuria and urgency.  Musculoskeletal: Positive for joint pain. Negative for back pain.  Skin: Negative for rash.  Neurological: Positive for weakness and headaches. Negative for dizziness, speech change and focal weakness.  Endo/Heme/Allergies: Does not bruise/bleed easily.  Psychiatric/Behavioral: Negative for depression.    DRUG ALLERGIES:   Allergies  Allergen Reactions  . Lipitor [Atorvastatin] Other (See Comments)    lupus  . Shellfish Allergy Nausea Only    No problems with topical betadine   VITALS:  Blood pressure (!) 114/50, pulse 88, temperature 97.8 F (36.6 C), temperature source Oral, resp. rate 19, height 5\' 4"  (1.626 m), weight 86.7 kg (191 lb 1.6 oz), SpO2 99 %. PHYSICAL EXAMINATION:  Physical Exam  Constitutional: She is oriented to person, place, and time and well-developed, well-nourished, and in no distress.  HENT:  Head: Normocephalic. Head is with contusion.  Eyes: Pupils are equal, round, and reactive to light. Conjunctivae and EOM are normal.  Neck: Normal range of motion. Neck supple. No tracheal deviation present. No thyromegaly present.  Cardiovascular: Normal rate, regular rhythm and normal heart sounds.     Pulmonary/Chest: Effort normal and breath sounds normal. No respiratory distress. She has no wheezes. She exhibits no tenderness.  Abdominal: Soft. Bowel sounds are normal. She exhibits no distension. There is no tenderness.  Musculoskeletal:       Right forearm: She exhibits tenderness.  RUE: - in splint  RLE - surgical incision - clean and dressing in place   Neurological: She is alert and oriented to person, place, and time. No cranial nerve deficit.  Skin: Skin is warm and dry. No rash noted.  Psychiatric: Mood and affect normal.   LABORATORY PANEL:  Female CBC  Recent Labs Lab 12/20/16 0726  WBC 12.5*  HGB 7.9*  HCT 23.1*  PLT 182   ------------------------------------------------------------------------------------------------------------------ Chemistries   Recent Labs Lab 12/20/16 0726  NA 132*  K 4.7  CL 101  CO2 24  GLUCOSE 140*  BUN 14  CREATININE 1.05*  CALCIUM 7.7*   RADIOLOGY:  Dg Hip Operative Unilat W Or W/o Pelvis Right  Result Date: 12/20/2016 CLINICAL DATA:  Intraoperative IM nail. EXAM: OPERATIVE right HIP (WITH PELVIS IF PERFORMED) 9 VIEWS TECHNIQUE: Fluoroscopic spot image(s) were submitted for interpretation post-operatively. COMPARISON:  12/18/2016 FINDINGS: Intraoperative fluoroscopy is obtained for surgical control purposes. Fluoroscopy time is recorded at 4 minutes 26 seconds. Nine spot fluoroscopic images are obtained. Spot fluoroscopic images obtained intraoperatively demonstrate placement of intramedullary rod and compression screw fixation of a comminuted inter trochanteric fracture of the right hip. Significant improvement of position of fracture fragments post fixation. Two distal locking screws are demonstrated at the intramedullary rod. Hardware appears intact. Incidental note of a right knee arthroplasty partially visualized.  IMPRESSION: Intraoperative fluoroscopy obtained for surgical control purposes, demonstrating intramedullary rod  fixation and compression bolt of inter trochanteric fracture of the right hip. Electronically Signed   By: Lucienne Capers M.D.   On: 12/20/2016 00:39   ASSESSMENT AND PLAN:  76 y.o. female who was in the kitchen and had a sudden fall directly onto her R hand and hip. She was unable to ambulate afterwards. X-rays in the ED show R intertrochanteric femur fracture and R distal radius fracture.   * Acute blood loss anemia - likely intraoperative - Hb 12.4 -> 7.9 - will transfuse 1 PRBC if Hb drops < 7  * Rt hip pain - s/p S/p R cephalomedullary nailing of hip POD 1 - pain control, PT  * Hyponatremia with mild dehydration. -Monitor with IV fluids  * History of chronic A. fib. Normal sinus rhythm now. - can resume aspirin if ortho is ok.  * Hypertension. Continue lisinopril  *Ear pain - outpt ENT evaluation   Pt and fmaily [facility] for [facility]NF  All the records are reviewed and case discussed with Care Management/Social Worker. Management plans discussed with the patient, nursing and they are in agreement.  CODE STATUS: Full Code  TOTAL TIME TAKING CARE OF THIS PATIENT: 35 minutes.   More than 50% of the time was spent in counseling/coordination of care: YES  POSSIBLE D/C IN 1-2 DAYS, DEPENDING ON CLINICAL CONDITION.  And orthopedic evaluation   Max Sane M.D on 12/20/2016 at 5:28 PM  Between 7am to 6pm - Pager - [phone]  After 6pm go to [email or web address] - Proofreader  [facility]  Office  [phone]  CC: Primary care physician; Dion Body, MD  Note: This dictation was prepared with Dragon dictation along with smaller phrase technology. Any transcriptional errors that result from this process are unintentional.

## 2016-12-21 LAB — BASIC METABOLIC PANEL
Anion gap: 5 (ref 5–15)
BUN: 14 mg/dL (ref 6–20)
CALCIUM: 7.4 mg/dL — AB (ref 8.9–10.3)
CO2: 24 mmol/L (ref 22–32)
Chloride: 101 mmol/L (ref 101–111)
Creatinine, Ser: 0.81 mg/dL (ref 0.44–1.00)
GFR calc Af Amer: >60 mL/min (ref >60)
GLUCOSE: 134 mg/dL — AB (ref 65–99)
Potassium: 4.3 mmol/L (ref 3.5–5.1)
Sodium: 130 mmol/L — ABNORMAL LOW (ref 135–145)

## 2016-12-21 LAB — CBC
HCT: 16.7 % — ABNORMAL LOW (ref 35.0–47.0)
Hemoglobin: 5.8 g/dL — ABNORMAL LOW (ref 12.0–16.0)
MCH: 30.9 pg (ref 26.0–34.0)
MCHC: 35 g/dL (ref 32.0–36.0)
MCV: 88.2 fL (ref 80.0–100.0)
PLATELETS: 140 10*3/uL — AB (ref 150–440)
RBC: 1.89 MIL/uL — ABNORMAL LOW (ref 3.80–5.20)
RDW: 13.5 % (ref 11.5–14.5)
WBC: 7.2 10*3/uL (ref 3.6–11.0)

## 2016-12-21 LAB — PREPARE RBC (CROSSMATCH)

## 2016-12-21 MED ORDER — SODIUM CHLORIDE 0.9 % IV SOLN
Freq: Once | INTRAVENOUS | Status: AC
  Administered 2016-12-21: 08:00:00 via INTRAVENOUS

## 2016-12-21 NOTE — Progress Notes (Signed)
   Subjective: 2 Days Post-Op Procedure(s) (LRB): INTRAMEDULLARY (IM) NAIL INTERTROCHANTRIC (Right)  Right distal radius fracture Patient reports pain as mild, Improved from yesterday. Wrist pain mild, swelling improving.  Patient is doing well, states she feels tired. Denies CP, SOB, ABD pain.  .  Objective: Vital signs in last 24 hours: Temp:  [99.1 F (37.3 C)-99.8 F (37.7 C)] 99.1 F (37.3 C) (10/04 0754) Pulse Rate:  [84-90] 84 (10/04 0754) Resp:  [15-16] 15 (10/04 0754) BP: (103-123)/(45-69) 123/45 (10/04 0754) SpO2:  [97 %-99 %] 99 % (10/04 0754)  Intake/Output from previous day: 10/03 0701 - 10/04 0700 In: 1303.8 [P.O.:360; I.V.:943.8] Out: 400 [Urine:400] Intake/Output this shift: No intake/output data recorded.   Recent Labs  12/18/16 1819 12/20/16 0125 12/20/16 0726 12/21/16 0654  HGB 12.4 8.5* 7.9* 5.8*    Recent Labs  12/20/16 0726 12/21/16 0654  WBC 12.5* 7.2  RBC 2.63* 1.89*  HCT 23.1* 16.7*  PLT 182 140*    Recent Labs  12/20/16 0726 12/21/16 0654  NA 132* 130*  K 4.7 4.3  CL 101 101  CO2 24 24  BUN 14 14  CREATININE 1.05* 0.81  GLUCOSE 140* 134*  CALCIUM 7.7* 7.4*    Recent Labs  12/19/16 0937  INR 1.07    EXAM General - Patient is Alert, Appropriate and Oriented Right lower Extremity - Neurovascular intact Sensation intact distally Intact pulses distally Dorsiflexion/Plantar flexion intact No cellulitis present Compartment soft  Dressing clean dry and intact, scant drainage middle incision site. Right Upper Extremity - Neurovascular intact Sensation intact distally Mild swelling through out digits Splint intact Motor Function - intact, moving foot and toes well on exam.   Past Medical History:  Diagnosis Date  . Arthritis   . Atrial fibrillation ([place])   . Chronic pain   . Chronic urethral narrowing    please use the smallest foley catheter available for her  . Depression   . Dysrhythmia    a fib. per  patient, dr. lithicum could not confirm this diagnosis  . GERD (gastroesophageal reflux disease)   . Hyperlipidemia   . Hypertension   . Hypokalemia   . Hyponatremia   . Hypothyroidism   . Osteoporosis     Assessment/Plan:   2 Days Post-Op Procedure(s) (LRB): INTRAMEDULLARY (IM) NAIL INTERTROCHANTRIC (Right) Active Problems:   Closed right hip fracture ([facility])   Right wrist fracture   Acute post op blood loss anemia with underlying chronic anemia  Estimated body mass index is 32.8 kg/m as calculated from the following:   Height as of this encounter: 5\' 4"  (1.626 m).   Weight as of this encounter: 86.7 kg (191 lb 1.6 oz). Advance diet Up with therapy, Touch down weight bearing RLE Recommend platform walker for right wrist fracture  Needs BM  Acute post op blood loss anemia with underlying chronic anemia - Hgb trending down. Will transfuse 2 units of PRBC and recheck Hgb.  Recheck labs in the am    DVT Prophylaxis - Lovenox, Foot Pumps and TED hose   T. Rachelle Hora, PA-C [place] 12/21/2016, 8:11 AM

## 2016-12-21 NOTE — Progress Notes (Signed)
[facility] Medicare SNF authorization has been received, auth # [id number], RVC, recommended 536 therapy minutes per week. Plan is for patient to D/C to [facility] when medically stable. [facility] liaison is aware of above. Patient is aware of above.   [employer], LCSW [phone]

## 2016-12-21 NOTE — Progress Notes (Signed)
OT Cancellation Note  Patient Details Name: Sara Greene MRN: [id number] DOB: 07/06/1940   Cancelled Treatment:    Reason Eval/Treat Not Completed: Medical issues which prohibited therapy. Hemoglobin of 5.8, per ortho, to transfuse 2 units of PRBC this am. Contraindicated for therapy this morning. Will continue to follow acutely and re-attempt OT treatment at later date/time as pt is medically appropriate.  Jeni Salles, MPH, MS, OTR/L ascom [phone] 12/21/16, 8:31 AM

## 2016-12-21 NOTE — Progress Notes (Signed)
OT Cancellation Note  Patient Details Name: Sara Greene MRN: [id number] DOB: 01-31-1941   Cancelled Treatment:    Reason Eval/Treat Not Completed: Other (comment). On 2nd attempt to treat, pt with RN who was setting up 2nd unit of blood for transfusion. On 3rd attempt, pt working with PT. Will re-attempt next date.  Jeni Salles, MPH, MS, OTR/L ascom [phone] 12/21/16, 4:59 PM

## 2016-12-21 NOTE — Progress Notes (Signed)
[place] at [place] NAME: Sara Greene    MR#:  [id number]  DATE OF BIRTH:  1941/01/18  SUBJECTIVE:  CHIEF COMPLAINT:   Chief Complaint  Patient presents with  . Fall  . Hip Pain  hemoGlobin dropped to 5.8, pain somewhat better control REVIEW OF SYSTEMS:  Review of Systems  Constitutional: Positive for malaise/fatigue. Negative for chills, fever and weight loss.  HENT: Negative for nosebleeds and sore throat.   Eyes: Negative for blurred vision.  Respiratory: Negative for cough, shortness of breath and wheezing.   Cardiovascular: Negative for chest pain, orthopnea, leg swelling and PND.  Gastrointestinal: Negative for abdominal pain, constipation, diarrhea, heartburn, nausea and vomiting.  Genitourinary: Negative for dysuria and urgency.  Musculoskeletal: Positive for joint pain. Negative for back pain.  Skin: Negative for rash.  Neurological: Positive for weakness and headaches. Negative for dizziness, speech change and focal weakness.  Endo/Heme/Allergies: Does not bruise/bleed easily.  Psychiatric/Behavioral: Negative for depression.   DRUG ALLERGIES:   Allergies  Allergen Reactions  . Lipitor [Atorvastatin] Other (See Comments)    lupus  . Shellfish Allergy Nausea Only    No problems with topical betadine   VITALS:  Blood pressure (!) 126/41, pulse 94, temperature 98.5 F (36.9 C), temperature source Axillary, resp. rate 18, height 5\' 4"  (1.626 m), weight 86.7 kg (191 lb 1.6 oz), SpO2 99 %. PHYSICAL EXAMINATION:  Physical Exam  Constitutional: She is oriented to person, place, and time and well-developed, well-nourished, and in no distress.  HENT:  Head: Normocephalic. Head is with contusion.  Eyes: Pupils are equal, round, and reactive to light. Conjunctivae and EOM are normal.  Neck: Normal range of motion. Neck supple. No tracheal deviation present. No thyromegaly present.  Cardiovascular: Normal rate, regular  rhythm and normal heart sounds.   Pulmonary/Chest: Effort normal and breath sounds normal. No respiratory distress. She has no wheezes. She exhibits no tenderness.  Abdominal: Soft. Bowel sounds are normal. She exhibits no distension. There is no tenderness.  Musculoskeletal:       Right forearm: She exhibits tenderness.  RUE: - in splint  RLE - surgical incision - clean and dressing in place   Neurological: She is alert and oriented to person, place, and time. No cranial nerve deficit.  Skin: Skin is warm and dry. No rash noted.  Psychiatric: Mood and affect normal.   LABORATORY PANEL:  Female CBC  Recent Labs Lab 12/21/16 0654  WBC 7.2  HGB 5.8*  HCT 16.7*  PLT 140*   ------------------------------------------------------------------------------------------------------------------ Chemistries   Recent Labs Lab 12/21/16 0654  NA 130*  K 4.3  CL 101  CO2 24  GLUCOSE 134*  BUN 14  CREATININE 0.81  CALCIUM 7.4*   RADIOLOGY:  No results found. ASSESSMENT AND PLAN:  76 y.o. female who was in the kitchen and had a sudden fall directly onto her R hand and hip. She was unable to ambulate afterwards. X-rays in the ED show R intertrochanteric femur fracture and R distal radius fracture.   * Acute blood loss anemia - likely intraoperative - Hb 12.4 -> 7.9->5.8 -Ordered 2 units of packed red blood cell transfusion today by orthopedics -Check labs again after transfusion -Lovenox will be held  * Rt hip pain - s/p S/p R cephalomedullary nailing of hip POD 2 - pain control, PT  * Hyponatremia with mild dehydration. -Monitor with IV fluids  * History of chronic  A. fib. Normal sinus rhythm now. - can resume aspirin if ortho is ok.  * Hypertension. Continue lisinopril  *Ear pain - outpt ENT evaluation   Pt and [facility] for [facility]  All the records are reviewed and case discussed with Care Management/Social Worker. Management plans discussed  with the patient, nursing and they are in agreement.  CODE STATUS: Full Code  TOTAL TIME TAKING CARE OF THIS PATIENT: 35 minutes.   More than 50% of the time was spent in counseling/coordination of care: YES  POSSIBLE D/C IN 1-2 DAYS, DEPENDING ON CLINICAL CONDITION.     Max Sane M.D on 12/21/2016 at 4:51 PM  Between 7am to 6pm - Pager - [phone]  After 6pm go to [email or web address] - Proofreader  [facility]  Office  [phone]  CC: Primary care physician; Dion Body, MD  Note: This dictation was prepared with Dragon dictation along with smaller phrase technology. Any transcriptional errors that result from this process are unintentional.

## 2016-12-21 NOTE — Progress Notes (Signed)
Physical Therapy Treatment Patient Details Name: Sara Greene MRN: [id number] DOB: 05-11-1940 Today's Date: 12/21/2016    History of Present Illness Pt admitted for closed R hip fx, s/p IM nailing on 10/2, and R distal radius fx from fall on 10/1. PMH of R wrist fx, chronic afib, arthritis, chronic pain, HTN, depression.     PT Comments    Pt is making limited progress towards goals, unable to perform OOB mobility secondary to decreased Hgb. Scheduled for transfusion this date. Able to perform supine there-ex with assist. Needs constant cues to maintain arousal level. Very pale this date, RN notified. O2 sats at 86% on 2L of O2, however shallow breathing noted. Will cues and exertion, able to improve O2 sats to 98%. Will continue to progress in PM if blood transfusion complete.  Follow Up Recommendations  SNF     Equipment Recommendations  None recommended by PT    Recommendations for Other Services       Precautions / Restrictions Precautions Precautions: Fall Restrictions Weight Bearing Restrictions: Yes RUE Weight Bearing: Non weight bearing RLE Weight Bearing: Touchdown weight bearing    Mobility  Bed Mobility               General bed mobility comments: not performed secondary to decreased Hgb and pending Blood Transfusion  Transfers                    Ambulation/Gait                 Stairs            Wheelchair Mobility    Modified Rankin (Stroke Patients Only)       Balance                                            Cognition Arousal/Alertness: Awake/alert Behavior During Therapy: WFL for tasks assessed/performed Overall Cognitive Status: Within Functional Limits for tasks assessed                                        Exercises Other Exercises Other Exercises: supine ther-ex performed on B LE including ankle pumps, glut sets, quad sets, SLRs, hip abd/add, and SAQ. Pt also performed R  shoulder flexion. All there-ex performed with min assist on R side and cga on L side. 12 reps performed. Multiple cues given to keep arousal level.    General Comments        Pertinent Vitals/Pain Pain Assessment: 0-10 Pain Score: 7  Pain Location: R hip and R wrist Pain Intervention(s): Limited activity within patient's tolerance;Repositioned    Home Living                      Prior Function            PT Goals (current goals can now be found in the care plan section) Acute Rehab PT Goals Patient Stated Goal: to return home PT Goal Formulation: With patient Time For Goal Achievement: 01/03/17 Potential to Achieve Goals: Good Progress towards PT goals: Progressing toward goals    Frequency    BID      PT Plan Current plan remains appropriate    Co-evaluation  AM-PAC PT "6 Clicks" Daily Activity  Outcome Measure  Difficulty turning over in bed (including adjusting bedclothes, sheets and blankets)?: Unable Difficulty moving from lying on back to sitting on the side of the bed? : Unable Difficulty sitting down on and standing up from a chair with arms (e.g., wheelchair, bedside commode, etc,.)?: Unable Help needed moving to and from a bed to chair (including a wheelchair)?: A Lot Help needed walking in hospital room?: A Lot Help needed climbing 3-5 steps with a railing? : A Lot 6 Click Score: 9    End of Session   Activity Tolerance: Patient limited by fatigue;Patient limited by pain Patient left: in bed;with call bell/phone within reach;with bed alarm set Nurse Communication: Mobility status PT Visit Diagnosis: Other abnormalities of gait and mobility (R26.89);Muscle weakness (generalized) (M62.81);Pain Pain - Right/Left: Right Pain - part of body: Hip     Time: 0927-0950 PT Time Calculation (min) (ACUTE ONLY): 23 min  Charges:  $Therapeutic Exercise: 23-37 mins                    G Codes:  Functional Assessment Tool Used:  AM-PAC 6 Clicks Basic Mobility Functional Limitation: Mobility: Walking and moving around Mobility: Walking and Moving Around Current Status (B3383): At least 60 percent but less than 80 percent impaired, limited or restricted Mobility: Walking and Moving Around Goal Status [phone]): At least 40 percent but less than 60 percent impaired, limited or restricted    Greggory Stallion, PT, DPT [phone]    [name] 12/21/2016, 11:21 AM

## 2016-12-21 NOTE — Progress Notes (Signed)
Pt. Hasn't voided throughout the night. Bladder scanner shows 498. Dr. Marcille Blanco notified and ordered for in and out cath not be done til pt. Has at least 600 in bladder.

## 2016-12-21 NOTE — Progress Notes (Signed)
Physical Therapy Treatment Patient Details Name: Sara Greene MRN: [id number] DOB: [birth year] Today's Date: 12/21/2016    History of Present Illness Pt admitted for closed R hip fx, s/p IM nailing on 10/2, and R distal radius fx from fall on 10/1. PMH of R wrist fx, chronic afib, arthritis, chronic pain, HTN, depression.     PT Comments    Pt is making limited progress towards goals, unable to perform OOB mobility due to decreased Hgb. Second blood transfusion was administered during session. Pt performed supine there-ex with min assist to initiate exercises for RLE, cues for proper technique. Pt appeared awake, eager to get better, and motivated to participate in PT throughout session. Will continue to progress.   Follow Up Recommendations  SNF     Equipment Recommendations  None recommended by PT    Recommendations for Other Services       Precautions / Restrictions Precautions Precautions: Fall Restrictions Weight Bearing Restrictions: Yes RUE Weight Bearing: Non weight bearing RLE Weight Bearing: Touchdown weight bearing    Mobility  Bed Mobility               General bed mobility comments: not performed secondary to decreased Hgb and second blood transfusion being administered  Transfers                    Ambulation/Gait                 Stairs            Wheelchair Mobility    Modified Rankin (Stroke Patients Only)       Balance                                            Cognition Arousal/Alertness: Awake/alert Behavior During Therapy: WFL for tasks assessed/performed Overall Cognitive Status: Within Functional Limits for tasks assessed                                        Exercises Other Exercises Other Exercises: Supine ther-ex performed on B LE 15x, ankle pumps, glute sets, quad sets, SLRs, hip abd/add, SAQs, resisted plantarflexion, heel slides. LUE ther-ex 15x resisted bicep curls,  shoulder flexion. Pt required min assist to initiate RLE exercises. Cued for proper technique.     General Comments        Pertinent Vitals/Pain Pain Assessment: Faces Faces Pain Scale: Hurts little more Pain Location: R hip Pain Descriptors / Indicators: Sore Pain Intervention(s): Limited activity within patient's tolerance;Monitored during session;Repositioned    Home Living                      Prior Function            PT Goals (current goals can now be found in the care plan section) Acute Rehab PT Goals Patient Stated Goal: to return home PT Goal Formulation: With patient Time For Goal Achievement: 01/03/17 Potential to Achieve Goals: Good Progress towards PT goals: Progressing toward goals    Frequency    BID      PT Plan Current plan remains appropriate    Co-evaluation              AM-PAC PT "6 Clicks" Daily Activity  Outcome Measure  Difficulty turning over in bed (including adjusting bedclothes, sheets and blankets)?: Unable Difficulty moving from lying on back to sitting on the side of the bed? : Unable Difficulty sitting down on and standing up from a chair with arms (e.g., wheelchair, bedside commode, etc,.)?: Unable Help needed moving to and from a bed to chair (including a wheelchair)?: A Lot Help needed walking in hospital room?: A Lot Help needed climbing 3-5 steps with a railing? : A Lot 6 Click Score: 9    End of Session   Activity Tolerance: Patient limited by fatigue;Patient limited by pain Patient left: in bed;with call bell/phone within reach;with bed alarm set Nurse Communication:  (Re-positioning of external catheter) PT Visit Diagnosis: Other abnormalities of gait and mobility (R26.89);Muscle weakness (generalized) (M62.81);Pain Pain - Right/Left: Right Pain - part of body: Hip     Time: 4580-9983 PT Time Calculation (min) (ACUTE ONLY): 28 min  Charges:                       G Codes:  Functional Assessment  Tool Used: AM-PAC 6 Clicks Basic Mobility Functional Limitation: Mobility: Walking and moving around Mobility: Walking and Moving Around Current Status (J8250): At least 60 percent but less than 80 percent impaired, limited or restricted Mobility: Walking and Moving Around Goal Status [phone]): At least 40 percent but less than 60 percent impaired, limited or restricted    Manfred Arch, SPT   Manfred Arch 12/21/2016, 5:51 PM

## 2016-12-22 LAB — BPAM RBC
BLOOD PRODUCT EXPIRATION DATE: 201810082359
BLOOD PRODUCT EXPIRATION DATE: 201810172359
ISSUE DATE / TIME: 201810041130
ISSUE DATE / TIME: 201810041611
UNIT TYPE AND RH: 5100
UNIT TYPE AND RH: 9500

## 2016-12-22 LAB — TYPE AND SCREEN
ABO/RH(D): O POS
ANTIBODY SCREEN: NEGATIVE
Unit division: 0
Unit division: 0

## 2016-12-22 LAB — BASIC METABOLIC PANEL
Anion gap: 5 (ref 5–15)
BUN: 11 mg/dL (ref 6–20)
CHLORIDE: 100 mmol/L — AB (ref 101–111)
CO2: 25 mmol/L (ref 22–32)
CREATININE: 0.81 mg/dL (ref 0.44–1.00)
Calcium: 7.5 mg/dL — ABNORMAL LOW (ref 8.9–10.3)
GFR calc non Af Amer: >60 mL/min (ref >60)
Glucose, Bld: 137 mg/dL — ABNORMAL HIGH (ref 65–99)
Potassium: 4 mmol/L (ref 3.5–5.1)
SODIUM: 130 mmol/L — AB (ref 135–145)

## 2016-12-22 LAB — CBC
HCT: 21.2 % — ABNORMAL LOW (ref 35.0–47.0)
HEMOGLOBIN: 7.6 g/dL — AB (ref 12.0–16.0)
MCH: 30.9 pg (ref 26.0–34.0)
MCHC: 36 g/dL (ref 32.0–36.0)
MCV: 85.8 fL (ref 80.0–100.0)
Platelets: 157 10*3/uL (ref 150–440)
RBC: 2.47 MIL/uL — AB (ref 3.80–5.20)
RDW: 14 % (ref 11.5–14.5)
WBC: 9 10*3/uL (ref 3.6–11.0)

## 2016-12-22 MED ORDER — OXYCODONE HCL 10 MG PO TABS: 10.0000 mg | ORAL_TABLET | ORAL | 0 refills | Status: DC | PRN

## 2016-12-22 MED ORDER — SENNOSIDES-DOCUSATE SODIUM 8.6-50 MG PO TABS
2.0000 | ORAL_TABLET | ORAL | Status: AC
  Administered 2016-12-22: 2 via ORAL
  Filled 2016-12-22: qty 2

## 2016-12-22 MED ORDER — MORPHINE SULFATE ER 30 MG PO TBCR: 30.0000 mg | EXTENDED_RELEASE_TABLET | Freq: Two times a day (BID) | ORAL | 0 refills | Status: AC

## 2016-12-22 NOTE — Progress Notes (Signed)
Patient is medically stable for D/C to [facility] today. Per [name] liaison patient can come today to room 608. [facility] Medicare SNF authorization has been received. RN will call report to RN Theressa Stamps at [phone] and arrange EMS for transport. Clinical Education officer, museum (CSW) sent D/C orders to [facility] via [facility]. Patient is aware of above. CSW attempted to call patient's daughter [name] however she did not answer and a voicemail was left. CSW attempted to call patient's husband [name] however he did not answer and a voicemail could not be left. Please reconsult if future social work needs arise. CSW signing off.   [employer], LCSW [phone]

## 2016-12-22 NOTE — Discharge Instructions (Signed)
Hip Fracture A hip fracture is a fracture of the upper part of your thigh bone (femur). What are the causes? A hip fracture is caused by a direct blow to the side of your hip. This is usually the result of a fall but can occur in other circumstances, such as an automobile accident. What increases the risk? There is an increased risk of hip fractures in people with:  An unsteady walking pattern (gait) and those with conditions that contribute to poor balance, such as Parkinson's disease or dementia.  Osteopenia and osteoporosis.  Cancer that spreads to the leg bones.  Certain metabolic diseases.  What are the signs or symptoms? Symptoms of hip fracture include:  Pain over the injured hip.  Inability to put weight on the leg in which the fracture occurred (although, some patients are able to walk after a hip fracture).  Toes and foot of the affected leg point outward when you lie down.  How is this diagnosed? A physical exam can determine if a hip fracture is likely to have occurred. X-ray exams are needed to confirm the fracture and to look for other injuries. The X-ray exam can help to determine the type of hip fracture. Rarely, the fracture is not visible on an X-ray image and a CT scan or MRI will have to be done. How is this treated? The treatment for a fracture is usually surgery. This means using a screw, nail, or rod to hold the bones in place. Follow these instructions at home: Take all medicines as directed by your health care provider. Contact a health care provider if: Pain continues, even after taking pain medicine. This information is not intended to replace advice given to you by your health care provider. Make sure you discuss any questions you have with your health care provider. Document Released: 03/06/2005 Document Revised: 08/12/2015 Document Reviewed: 10/16/2012 Elsevier Interactive Patient Education  2017 [employer]  

## 2016-12-22 NOTE — Progress Notes (Addendum)
   Subjective: 3 Days Post-Op Procedure(s) (LRB): INTRAMEDULLARY (IM) NAIL INTERTROCHANTRIC (Right)  Right distal radius fracture Patient reports right hip pain as mild.  Right wrist pain mild, swelling improving.  Patient is doing well. No complaints. Much improved from 2 units of PRBC. Denies CP, SOB, ABD pain.  .  Objective: Vital signs in last 24 hours: Temp:  [98.5 F (36.9 C)-99.4 F (37.4 C)] 98.9 F (37.2 C) (10/05 0735) Pulse Rate:  [77-96] 77 (10/05 0735) Resp:  [18-20] 18 (10/05 0735) BP: (112-145)/(41-52) 131/46 (10/05 0735) SpO2:  [90 %-100 %] 100 % (10/05 0735)  Intake/Output from previous day: 10/04 0701 - 10/05 0700 In: 2816.3 [P.O.:480; I.V.:1686.3; Blood:650] Out: 1650 [Urine:1650] Intake/Output this shift: Total I/O In: -  Out: 800 [Urine:800]   Recent Labs  12/20/16 0125 12/20/16 0726 12/21/16 0654 12/22/16 0321  HGB 8.5* 7.9* 5.8* 7.6*    Recent Labs  12/21/16 0654 12/22/16 0321  WBC 7.2 9.0  RBC 1.89* 2.47*  HCT 16.7* 21.2*  PLT 140* 157    Recent Labs  12/21/16 0654 12/22/16 0321  NA 130* 130*  K 4.3 4.0  CL 101 100*  CO2 24 25  BUN 14 11  CREATININE 0.81 0.81  GLUCOSE 134* 137*  CALCIUM 7.4* 7.5*    Recent Labs  12/19/16 0937  INR 1.07    EXAM General - Patient is Alert, Appropriate and Oriented Right lower Extremity - Neurovascular intact Sensation intact distally Intact pulses distally Dorsiflexion/Plantar flexion intact No cellulitis present Compartment soft  Dressing clean dry and intact, scant drainage middle incision site. New dressing applied Right Upper Extremity - Neurovascular intact Sensation intact distally Mild swelling through out digits Splint intact Motor Function - intact, moving foot and toes well on exam.   Past Medical History:  Diagnosis Date  . Arthritis   . Atrial fibrillation ([place])   . Chronic pain   . Chronic urethral narrowing    please use the smallest foley catheter  available for her  . Depression   . Dysrhythmia    a fib. per patient, dr. lithicum could not confirm this diagnosis  . GERD (gastroesophageal reflux disease)   . Hyperlipidemia   . Hypertension   . Hypokalemia   . Hyponatremia   . Hypothyroidism   . Osteoporosis     Assessment/Plan:   3 Days Post-Op Procedure(s) (LRB): INTRAMEDULLARY (IM) NAIL INTERTROCHANTRIC (Right) Active Problems:   Closed right hip fracture (H[facility])   Right wrist fracture   Acute post op blood loss anemia with underlying chronic anemia  Estimated body mass index is 32.8 kg/m as calculated from the following:   Height as of this encounter: 5\' 4"  (1.626 m).   Weight as of this encounter: 86.7 kg (191 lb 1.6 oz). Advance diet Up with therapy, Touch down weight bearing RLE Recommend platform walker for right wrist fracture  Acute post op blood loss anemia with underlying chronic anemia - Hgb Improved. Status post 2 units.   Discharge to SNF today with aspirin only.  DVT Prophylaxis - Aspirin, Foot Pumps and TED hose   T. Rachelle Hora, PA-C [place] 12/22/2016, 8:45 AM

## 2016-12-22 NOTE — Discharge Summary (Signed)
Report called to [facility]. Spoke with Elmyra Ricks. Printed prescription for Morphine and Oxycodone in transfer package along with printed AVS.  Wynema Birch, RN

## 2016-12-22 NOTE — Progress Notes (Signed)
Physical Therapy Treatment Patient Details Name: Sara Greene MRN: [id number] DOB: [birth year] Today's Date: 12/22/2016    History of Present Illness Pt admitted for closed R hip fx, s/p IM nailing on 10/2, and R distal radius fx from fall on 10/1. PMH of R wrist fx, chronic afib, arthritis, chronic pain, HTN, depression.     PT Comments    Pt is making good progress towards goals. Pt performed supine there-ex, reviewed WB status prior to mobility activities. Pt performed bed mobility with mod assist, transfers and amb with R platform walker with mod assist, +2 for safety and physical assist to maintain WB status. Pt performed bed mobility/transfers by sitting on R side of bed, pt amb to L side to sit in recliner. Recliner re-positioned to L of pt to help with pivoting on LLE.  Pt required cues throughout all mobility activities. Pt placed on room air, monitored O2 sats throughout, >90%.  Pt appeared motivated to participate in PT.    Follow Up Recommendations  SNF     Equipment Recommendations  None recommended by PT    Recommendations for Other Services       Precautions / Restrictions Precautions Precautions: Fall Restrictions Weight Bearing Restrictions: Yes RUE Weight Bearing: Non weight bearing RLE Weight Bearing: Touchdown weight bearing    Mobility  Bed Mobility Overal bed mobility: Needs Assistance Bed Mobility: Supine to Sit     Supine to sit: Mod assist     General bed mobility comments: Pt able to rise from supine to seated EOB, required cues for proper sequencing, pt reported pain in R hip, mod assist to help guide trunk and LEs. Pt cued to use L hand to scoot.   Transfers Overall transfer level: Needs assistance Equipment used: Rolling walker (2 wheeled) Transfers: Sit to/from Stand Sit to Stand: Mod assist;+2 safety/equipment;+2 physical assistance (to maintain precautions)         General transfer comment: Pt able to rise from seated EOB to stand with  platform walker, cues for proper technique to maintain RLE/RUE WB status. Pt relied heavily on platform walker for UE support, mod assist to rise to standing.   Ambulation/Gait Ambulation/Gait assistance: Mod assist;+2 physical assistance;+2 safety/equipment (to maintain precautions) Ambulation Distance (Feet): 2 Feet Assistive device: Right platform walker (+2 assist for safety and to maintain precautions)       General Gait Details: Pt amb by pivoting on LLE to keep RLE TTWB, required cues for proper sequencing, mod assist to help pt guide RW and initiate stepping with LLE.    Stairs            Wheelchair Mobility    Modified Rankin (Stroke Patients Only)       Balance Overall balance assessment: Needs assistance Sitting-balance support: Feet supported Sitting balance-Leahy Scale: Good Sitting balance - Comments: Pt able to sit at EOB and maintain position for several min. No dizziness or LOB.   Standing balance support: Bilateral upper extremity supported Standing balance-Leahy Scale: Good Standing balance comment: Pt able to stand at EOB with platform walker, cued to maintain TTWB on R, +2 for safety and physical assistance. No dizziness                            Cognition Arousal/Alertness: Awake/alert Behavior During Therapy: WFL for tasks assessed/performed Overall Cognitive Status: Within Functional Limits for tasks assessed  Exercises Other Exercises Other Exercises: Supine ther-ex, 15x, B ankle pumps, B glute sets, R SLRs, R SAQs, R hip abd/add. Pt required CGA to initiate SLRs and hip abd/add on R.     General Comments        Pertinent Vitals/Pain Pain Assessment: Faces Pain Score: 4  Faces Pain Scale: Hurts little more Pain Location: R hip Pain Descriptors / Indicators: Sore Pain Intervention(s): Limited activity within patient's tolerance;Monitored during session;Repositioned     Home Living                      Prior Function            PT Goals (current goals can now be found in the care plan section) Acute Rehab PT Goals Patient Stated Goal: to return home PT Goal Formulation: With patient Time For Goal Achievement: 01/03/17 Potential to Achieve Goals: Good Progress towards PT goals: Progressing toward goals    Frequency    BID      PT Plan Current plan remains appropriate    Co-evaluation              AM-PAC PT "6 Clicks" Daily Activity  Outcome Measure  Difficulty turning over in bed (including adjusting bedclothes, sheets and blankets)?: Unable Difficulty moving from lying on back to sitting on the side of the bed? : Unable Difficulty sitting down on and standing up from a chair with arms (e.g., wheelchair, bedside commode, etc,.)?: Unable Help needed moving to and from a bed to chair (including a wheelchair)?: A Lot Help needed walking in hospital room?: A Lot Help needed climbing 3-5 steps with a railing? : A Lot 6 Click Score: 9    End of Session Equipment Utilized During Treatment: Gait belt Activity Tolerance: Patient tolerated treatment well;Patient limited by pain Patient left: in chair;with call bell/phone within reach;with bed alarm set Nurse Communication: Mobility status PT Visit Diagnosis: Other abnormalities of gait and mobility (R26.89);Unsteadiness on feet (R26.81);Muscle weakness (generalized) (M62.81);Pain Pain - Right/Left: Right Pain - part of body: Hip     Time: 3500-9381 PT Time Calculation (min) (ACUTE ONLY): 29 min  Charges:                       G Codes:  Functional Assessment Tool Used: AM-PAC 6 Clicks Basic Mobility Functional Limitation: Mobility: Walking and moving around Mobility: Walking and Moving Around Current Status (W2993): At least 60 percent but less than 80 percent impaired, limited or restricted Mobility: Walking and Moving Around Goal Status [phone]): At least 40 percent  but less than 60 percent impaired, limited or restricted    Manfred Arch, SPT   Manfred Arch 12/22/2016, 12:46 PM

## 2016-12-22 NOTE — Care Management Important Message (Signed)
Important Message  Patient Details  Name: Sara Greene MRN: [id number] Date of Birth: January 31, 1941   Medicare Important Message Given:  Yes    Jolly Mango, RN 12/22/2016, 8:18 AM

## 2016-12-22 NOTE — Progress Notes (Signed)
Pts. Hemoglobin is 7.6 after receiving 2 units pRBC;s yesterday. Dr. Marcille Blanco notified and no new orders received.

## 2016-12-22 NOTE — Discharge Summary (Signed)
[facility] [place] NAME: Sara Greene    MR#:  [id number]  DATE OF BIRTH:  [birth year]  DATE OF ADMISSION:  12/18/2016   ADMITTING PHYSICIAN: Demetrios Loll, MD  DATE OF DISCHARGE: 12/22/2016  PRIMARY CARE PHYSICIAN: Dion Body, MD   ADMISSION DIAGNOSIS:  Closed fracture of right hip, initial encounter ([place]) [S72.001A] Closed fracture of right wrist, initial encounter [S62.101A] DISCHARGE DIAGNOSIS:  Active Problems:   Closed right hip fracture ([place])  SECONDARY DIAGNOSIS:   Past Medical History:  Diagnosis Date  . Arthritis   . Atrial fibrillation ([place])   . Chronic pain   . Chronic urethral narrowing    please use the smallest foley catheter available for her  . Depression   . Dysrhythmia    a fib. per patient, dr. lithicum could not confirm this diagnosis  . GERD (gastroesophageal reflux disease)   . Hyperlipidemia   . Hypertension   . Hypokalemia   . Hyponatremia   . Hypothyroidism   . Osteoporosis    HOSPITAL COURSE:  76 y.o.femalewho was in the kitchen and had a sudden fall directly onto her R hand and hip. She was unable to ambulate afterwards. X-rays in the ED show R intertrochanteric femur fracture and R distal radius fracture.   * Acute blood loss anemia - likely intraoperative - Hb 12.4 -> 7.9->5.8->7.6 -s/p 2 units of packed red blood cell transfusion today by orthopedics  * Rt hip fracture - s/p S/p R cephalomedullary nailing of hip POD 3 - Touch down weight bearing RLE - platform walker for right wrist fracture  * Hyponatremia with mild dehydration. -improved with hydration  * History of chronic A. fib. Normal sinus rhythm now. -  resume aspirin - consider hold/stop if hemoglobin drops  DISCHARGE CONDITIONS:  stable CONSULTS OBTAINED:  Treatment Team:  Leim Fabry, MD DRUG ALLERGIES:   Allergies  Allergen Reactions  . Lipitor [Atorvastatin] Other (See Comments)    lupus  . Shellfish  Allergy Nausea Only    No problems with topical betadine   DISCHARGE MEDICATIONS:   Allergies as of 12/22/2016      Reactions   Lipitor [atorvastatin] Other (See Comments)   lupus   Shellfish Allergy Nausea Only   No problems with topical betadine      Medication List    TAKE these medications   aspirin 325 MG EC tablet Take 1 tablet (325 mg total) by mouth 2 (two) times daily. What changed:  how much to take   calcium-vitamin D 500-200 MG-UNIT tablet Commonly known as:  OSCAL WITH D Take 2 tablets by mouth daily.   CENTRUM SILVER ADULT 50+ PO Take 1 tablet by mouth daily.   doxepin 50 MG capsule Commonly known as:  SINEQUAN Take 50 mg by mouth at bedtime.   esomeprazole 20 MG capsule Commonly known as:  NEXIUM Take 20 mg by mouth every morning.   gabapentin 600 MG tablet Commonly known as:  NEURONTIN Take 600 mg by mouth every morning.   letrozole 2.5 MG tablet Commonly known as:  FEMARA Take 2.5 mg by mouth at bedtime.   levothyroxine 100 MCG tablet Commonly known as:  SYNTHROID, LEVOTHROID Take 100 mcg by mouth daily before breakfast.   lisinopril 5 MG tablet Commonly known as:  PRINIVIL,ZESTRIL Take 5 mg by mouth daily.   morphine 30 MG 12 hr tablet Commonly known as:  MS CONTIN Take 1 tablet (30 mg total) by mouth every  12 (twelve) hours.   oxybutynin 5 MG tablet Commonly known as:  DITROPAN Take 5 mg by mouth 2 (two) times daily.   Oxycodone HCl 10 MG Tabs Take 1 tablet (10 mg total) by mouth every 3 (three) hours as needed.   PARoxetine 30 MG tablet Commonly known as:  PAXIL Take 30 mg by mouth every morning.   potassium chloride 10 MEQ tablet Commonly known as:  K-DUR,KLOR-CON Take 10 mEq by mouth daily.   pravastatin 40 MG tablet Commonly known as:  PRAVACHOL Take 40 mg by mouth at bedtime.   PRILOSEC 20 MG capsule Generic drug:  omeprazole Take 20 mg by mouth at bedtime as needed.        DISCHARGE INSTRUCTIONS:  Touch down  weight bearing RLE Recommend platform walker for right wrist fracture  CBC in 1 week with results to PCP for Hb monitoring DIET:  Regular diet DISCHARGE CONDITION:  Good ACTIVITY:  Activity as tolerated - Touch down weight bearing RLE Recommend platform walker for right wrist fracture OXYGEN:  Home Oxygen: No.  Oxygen Delivery: room air DISCHARGE LOCATION:  nursing home   If you experience worsening of your admission symptoms, develop shortness of breath, life threatening emergency, suicidal or homicidal thoughts you must seek medical attention immediately by calling 911 or calling your MD immediately  if symptoms less severe.  You Must read complete instructions/literature along with all the possible adverse reactions/side effects for all the Medicines you take and that have been prescribed to you. Take any new Medicines after you have completely understood and accpet all the possible adverse reactions/side effects.   Please note  You were cared for by a hospitalist during your hospital stay. If you have any questions about your discharge medications or the care you received while you were in the hospital after you are discharged, you can call the unit and asked to speak with the hospitalist on call if the hospitalist that took care of you is not available. Once you are discharged, your primary care physician will handle any further medical issues. Please note that NO REFILLS for any discharge medications will be authorized once you are discharged, as it is imperative that you return to your primary care physician (or establish a relationship with a primary care physician if you do not have one) for your aftercare needs so that they can reassess your need for medications and monitor your lab values.    On the day of Discharge:  VITAL SIGNS:  Blood pressure (!) 142/51, pulse 80, temperature 99.4 F (37.4 C), temperature source Oral, resp. rate 20, height 5\' 4"  (1.626 m), weight 86.7 kg  (191 lb 1.6 oz), SpO2 99 %. PHYSICAL EXAMINATION:  GENERAL:  76 y.o.-year-old patient lying in the bed with no acute distress.  EYES: Pupils equal, round, reactive to light and accommodation. No scleral icterus. Extraocular muscles intact.  HEENT: Head atraumatic, normocephalic. Oropharynx and nasopharynx clear.  NECK:  Supple, no jugular venous distention. No thyroid enlargement, no tenderness.  LUNGS: Normal breath sounds bilaterally, no wheezing, rales,rhonchi or crepitation. No use of accessory muscles of respiration.  CARDIOVASCULAR: S1, S2 normal. No murmurs, rubs, or gallops.  ABDOMEN: Soft, non-tender, non-distended. Bowel sounds present. No organomegaly or mass.  EXTREMITIES: No pedal edema, cyanosis, or clubbing.  NEUROLOGIC: Cranial nerves II through XII are intact. Muscle strength 5/5 in all extremities. Sensation intact. Gait not checked.  PSYCHIATRIC: The patient is alert and oriented x 3.  SKIN: No obvious rash, lesion,  or ulcer.  DATA REVIEW:   CBC  Recent Labs Lab 12/22/16 0321  WBC 9.0  HGB 7.6*  HCT 21.2*  PLT 157    Chemistries   Recent Labs Lab 12/22/16 0321  NA 130*  K 4.0  CL 100*  CO2 25  GLUCOSE 137*  BUN 11  CREATININE 0.81  CALCIUM 7.5*     Contact information for follow-up providers    Dion Body, MD. Schedule an appointment as soon as possible for a visit in 1 week(s).   Specialty:  Family Medicine Contact information: [place] [address] [phone]        Leim Fabry, MD. Schedule an appointment as soon as possible for a visit in 2 week(s).   Specialty:  Orthopedic Surgery Contact information: [place] [address] [phone]            Contact information for after-discharge care    Destination    [facility] Follow up.   Specialty:  [place] information: [address] [place] [phone]                  Management plans discussed with the patient, family and they are in agreement.  CODE STATUS: Full Code   TOTAL TIME TAKING CARE OF THIS PATIENT: 45 minutes.    Max Sane M.D on 12/22/2016 at 8:14 AM  Between 7am to 6pm - Pager - [phone]  After 6pm go to [email or web address] - Proofreader  [facility]  Office  [phone]  CC: Primary care physician; Dion Body, MD   Note: This dictation was prepared with Dragon dictation along with smaller phrase technology. Any transcriptional errors that result from this process are unintentional.

## 2016-12-22 NOTE — Clinical Social Work Placement (Signed)
   CLINICAL SOCIAL WORK PLACEMENT  NOTE  Date:  12/22/2016  Patient Details  Name: Sara Greene MRN: [id number] Date of Birth: [birth year]  Clinical Social Work is seeking post-discharge placement for this patient at the [place] level of care (*CSW will initial, date and re-position this form in  chart as items are completed):  Yes   Patient/family provided with [facility] Work Department's list of facilities offering this level of care within the geographic area requested by the patient (or if unable, by the patient's family).  Yes   Patient/family informed of their freedom to choose among providers that offer the needed level of care, that participate in Medicare, Medicaid or managed care program needed by the patient, have an available bed and are willing to accept the patient.  Yes   Patient/family informed of [facility]'s ownership interest in [facility] and [facility], as well as of the fact that they are under no obligation to receive care at these facilities.  PASRR submitted to EDS on       PASRR number received on       Existing PASRR number confirmed on 12/19/16     FL2 transmitted to all facilities in geographic area requested by pt/family on 12/19/16     FL2 transmitted to all facilities within larger geographic area on       Patient informed that his/her managed care company has contracts with or will negotiate with certain facilities, including the following:        Yes   Patient/family informed of bed offers received.  [facility] bed at  [facility] )     Physician recommends and patient chooses bed at      Patient to be transferred to  ([facility] ) on 12/22/16.  Patient to be transferred to facility by  [facility] )     Patient family notified on 12/22/16 of transfer.  Name of family member notified:   (CSW left patient's daughter [name] a voicemail making her aware of D/C today. )     PHYSICIAN        Additional Comment:    _______________________________________________ Adalena Abdulla, Veronia Beets, LCSW 12/22/2016, 8:32 AM

## 2016-12-23 NOTE — Op Note (Addendum)
DATE OF SURGERY: 12/19/16  PREOPERATIVE DIAGNOSIS: Right intertrochanteric fracture  POSTOPERATIVE DIAGNOSIS: Right intertrochanteric fracture  PROCEDURE: Intramedullary nailing of R femur with cephalomedullary device  SURGEON: Cato Mulligan, MD  ASSISTANTS: none  EBL: 700 cc  COMPONENTS:  Stryker Long Gamma Nail: 10x358mm; 55mm locking screw; 2 distal interolocking screws.   INDICATIONS: Sara Greene is a 76 y.o. female who sustained a 4-part intertrochanteric fracture after a fall. Risks and benefits of intramedullary nailing were explained to the patient. Risks include but are not limited to bleeding, infection, injury to tissues, nerves, vessels, periprosthetic infection, dislocation, limb length discrepancy and risks of anesthesia. The patient understands these risks, has completed an informed consent, and wishes to proceed.   PROCEDURE:  The patient was brought into the operating room. After administering anesthesia, the patient was placed in the supine position on the fracture table. The uninjured leg was placed in a flexed and abducted position while the injured lower extremity was placed in longitudinal traction. The fracture was reduced using longitudinal traction and internal rotation. We could not achieve a satisfactory reduction closed. The femoral head and neck fragment remained anteriorly translated. The lateral aspects of the right hip and thigh were prepped with ChloraPrep solution before being draped sterilely. Preoperative IV antibiotics were administered. A timeout was performed to verify the appropriate surgical site, patient, and procedure.   The greater trochanter was identified and an approximately 6 cm incision was made about 3 fingerbreadths above the tip of the greater trochanter. The incision was carried down through the subcutaneous tissues to expose the gluteal fascia. This was split the length of the incision, providing access to the tip of the trochanter. Under  fluoroscopic guidance, a guidewire was drilled through the tip of the trochanter into the proximal metaphysis to the level of the lesser trochanter. After verifying its position fluoroscopically in AP and lateral projections, it was overreamed with the initial reamer to the depth of the lesser trochanter. A guidewire was passed down through the femoral canal to the supracondylar region. The adequacy of guidewire position was verified fluoroscopically in AP and lateral projections before the length of the guidewire within the canal was measured and found to be 400 mm. Therefore, a 380 mm length nail was selected. Next, an open incision was made over the lateral femur at the level of the vastus ridge. IT band was incised. A bone hook was placed anterior the the femoral neck and then posterior pressure was applied. This adequately reduced the anterior translation of the femoral neck. Holding the reduction in this manner, The guidewire was overreamed sequentially using the flexible reamers, beginning with an 11 mm reamer and progressing to a 12 mm reamer. This provided good cortical chatter. The Stryker Gamma Nail was selected and advanced to the appropriate depth as verified fluoroscopically.   The guide system for the lag screw was positioned and advanced through an approximately 3 cm stab incision over the lateral aspect of the proximal femur. The guidewire was drilled up through the femoral nail and into the femoral neck to rest within 5 mm of subchondral bone. After verifying its position in the femoral neck and head in both AP and lateral projections, the guidewire was measured and found to be optimally replicated by a 95 mm lag screw. The guidewire was overreamed to the appropriate depth before the lag screw was inserted and advanced to the appropriate depth as verified fluoroscopically in AP and lateral projections. The locking screw was advanced, and the  fracture was compressed. The set screw was tightened  and then untightened a quarter turn to allow for compression. Again the adequacy of hardware position and fracture reduction was verified fluoroscopically in AP and lateral projections.  Attention was directed distally. Using the "perfect circle" technique, the leg and fluoroscopy machine were positioned appropriately. An approximate 1.5 cm incision was made over the skin and IT band at the appropriate point before the drill bit was advanced through the cortex and across the static hole of the nail. This was similarly done for the oblong hole. Appropriate screw lengths were determined with a measuring guide. Two distal interlocking screws were placed. Again the adequacy of screw position was verified fluoroscopically in AP and lateral projections and found to be excellent.  The wounds were irrigated thoroughly with sterile saline solution. Deep fascia of the IT band was closed with 0-Vicryl. The subcutaneous tissues were closed using 2-0 Vicryl interrupted sutures. The skin was closed using staples. Sterile occlusive dressings were applied to all wounds. The patient was then transferred to the recovery room in satisfactory condition after tolerating the procedure well.  POSTOPERATIVE PLAN: The patient will be FFWB on the operative extremity. Lovenox 40mg /day x 4 weeks to start on POD#1. Ancef x 24 hours. PT/OT on POD#1.

## 2016-12-26 ENCOUNTER — Ambulatory Visit: Payer: Medicare Other | Admitting: Physical Therapy

## 2017-04-23 ENCOUNTER — Other Ambulatory Visit: Payer: Self-pay

## 2017-04-23 ENCOUNTER — Encounter: Payer: Self-pay | Admitting: Emergency Medicine

## 2017-04-23 DIAGNOSIS — Z79899 Other long term (current) drug therapy: Secondary | ICD-10-CM | POA: Diagnosis not present

## 2017-04-23 DIAGNOSIS — I482 Chronic atrial fibrillation: Secondary | ICD-10-CM | POA: Insufficient documentation

## 2017-04-23 DIAGNOSIS — K219 Gastro-esophageal reflux disease without esophagitis: Secondary | ICD-10-CM | POA: Diagnosis not present

## 2017-04-23 DIAGNOSIS — E039 Hypothyroidism, unspecified: Secondary | ICD-10-CM | POA: Diagnosis not present

## 2017-04-23 DIAGNOSIS — A09 Infectious gastroenteritis and colitis, unspecified: Secondary | ICD-10-CM | POA: Insufficient documentation

## 2017-04-23 DIAGNOSIS — Z853 Personal history of malignant neoplasm of breast: Secondary | ICD-10-CM | POA: Diagnosis not present

## 2017-04-23 DIAGNOSIS — I10 Essential (primary) hypertension: Secondary | ICD-10-CM | POA: Diagnosis not present

## 2017-04-23 DIAGNOSIS — F329 Major depressive disorder, single episode, unspecified: Secondary | ICD-10-CM | POA: Insufficient documentation

## 2017-04-23 DIAGNOSIS — R110 Nausea: Secondary | ICD-10-CM | POA: Diagnosis present

## 2017-04-23 DIAGNOSIS — E860 Dehydration: Secondary | ICD-10-CM | POA: Diagnosis not present

## 2017-04-23 DIAGNOSIS — Z7982 Long term (current) use of aspirin: Secondary | ICD-10-CM | POA: Diagnosis not present

## 2017-04-23 DIAGNOSIS — E785 Hyperlipidemia, unspecified: Secondary | ICD-10-CM | POA: Insufficient documentation

## 2017-04-23 DIAGNOSIS — E871 Hypo-osmolality and hyponatremia: Principal | ICD-10-CM | POA: Insufficient documentation

## 2017-04-23 DIAGNOSIS — E876 Hypokalemia: Secondary | ICD-10-CM | POA: Diagnosis not present

## 2017-04-23 LAB — URINALYSIS, COMPLETE (UACMP) WITH MICROSCOPIC
Bacteria, UA: NONE SEEN
Bilirubin Urine: NEGATIVE
GLUCOSE, UA: NEGATIVE mg/dL
HGB URINE DIPSTICK: NEGATIVE
Ketones, ur: NEGATIVE mg/dL
NITRITE: NEGATIVE
Protein, ur: NEGATIVE mg/dL
SPECIFIC GRAVITY, URINE: 1.005 (ref 1.005–1.030)
pH: 7 (ref 5.0–8.0)

## 2017-04-23 LAB — CBC
HCT: 35.8 % (ref 35.0–47.0)
Hemoglobin: 12.2 g/dL (ref 12.0–16.0)
MCH: 27.6 pg (ref 26.0–34.0)
MCHC: 34.1 g/dL (ref 32.0–36.0)
MCV: 80.9 fL (ref 80.0–100.0)
Platelets: 234 10*3/uL (ref 150–440)
RBC: 4.43 MIL/uL (ref 3.80–5.20)
RDW: 16 % — AB (ref 11.5–14.5)
WBC: 5.9 10*3/uL (ref 3.6–11.0)

## 2017-04-23 LAB — COMPREHENSIVE METABOLIC PANEL
ALBUMIN: 4.3 g/dL (ref 3.5–5.0)
ALK PHOS: 132 U/L — AB (ref 38–126)
ALT: 13 U/L — ABNORMAL LOW (ref 14–54)
AST: 33 U/L (ref 15–41)
Anion gap: 12 (ref 5–15)
BUN: 11 mg/dL (ref 6–20)
CALCIUM: 9.1 mg/dL (ref 8.9–10.3)
CO2: 22 mmol/L (ref 22–32)
Chloride: 89 mmol/L — ABNORMAL LOW (ref 101–111)
Creatinine, Ser: 0.98 mg/dL (ref 0.44–1.00)
GFR calc Af Amer: >60 mL/min (ref >60)
GFR calc non Af Amer: 54 mL/min — ABNORMAL LOW (ref >60)
GLUCOSE: 120 mg/dL — AB (ref 65–99)
Potassium: 4.2 mmol/L (ref 3.5–5.1)
Sodium: 123 mmol/L — ABNORMAL LOW (ref 135–145)
TOTAL PROTEIN: 7.5 g/dL (ref 6.5–8.1)
Total Bilirubin: 0.5 mg/dL (ref 0.3–1.2)

## 2017-04-23 LAB — LIPASE, BLOOD: Lipase: 22 U/L (ref 11–51)

## 2017-04-23 LAB — TROPONIN I

## 2017-04-23 MED ORDER — ONDANSETRON 4 MG PO TBDP
4.0000 mg | ORAL_TABLET | Freq: Once | ORAL | Status: AC | PRN
  Administered 2017-04-23: 4 mg via ORAL
  Filled 2017-04-23: qty 1

## 2017-04-23 NOTE — ED Triage Notes (Signed)
Pt presents to ED from home with c/o nausea since around 1930 tonight. Took nausea tablet at home with no relief. Pt denies vomiting or pain but states she feels "funny" in her upper abd. Pt reports feeling like she is going to "pass out".

## 2017-04-24 ENCOUNTER — Observation Stay
Admission: EM | Admit: 2017-04-24 | Discharge: 2017-04-25 | Disposition: A | Discharge status: Home / Self Care | Payer: Medicare Other | Attending: Internal Medicine | Admitting: Internal Medicine

## 2017-04-24 ENCOUNTER — Encounter: Payer: Self-pay | Admitting: Internal Medicine

## 2017-04-24 ENCOUNTER — Other Ambulatory Visit: Payer: Self-pay

## 2017-04-24 DIAGNOSIS — E871 Hypo-osmolality and hyponatremia: Secondary | ICD-10-CM | POA: Diagnosis present

## 2017-04-24 DIAGNOSIS — R110 Nausea: Secondary | ICD-10-CM

## 2017-04-24 LAB — BASIC METABOLIC PANEL
ANION GAP: 11 (ref 5–15)
BUN: 9 mg/dL (ref 6–20)
CALCIUM: 8.7 mg/dL — AB (ref 8.9–10.3)
CO2: 20 mmol/L — ABNORMAL LOW (ref 22–32)
Chloride: 98 mmol/L — ABNORMAL LOW (ref 101–111)
Creatinine, Ser: 0.84 mg/dL (ref 0.44–1.00)
GFR calc Af Amer: >60 mL/min (ref >60)
Glucose, Bld: 103 mg/dL — ABNORMAL HIGH (ref 65–99)
Potassium: 4 mmol/L (ref 3.5–5.1)
Sodium: 129 mmol/L — ABNORMAL LOW (ref 135–145)

## 2017-04-24 LAB — TROPONIN I: Troponin I: <0.03 ng/mL (ref <0.03)

## 2017-04-24 MED ORDER — DOXEPIN HCL 50 MG PO CAPS
50.0000 mg | ORAL_CAPSULE | Freq: Every day | ORAL | Status: DC
  Administered 2017-04-24: 22:00:00 50 mg via ORAL
  Filled 2017-04-24 (×2): qty 1

## 2017-04-24 MED ORDER — PROMETHAZINE HCL 25 MG/ML IJ SOLN
12.5000 mg | Freq: Four times a day (QID) | INTRAMUSCULAR | Status: DC | PRN
  Administered 2017-04-24: 09:00:00 12.5 mg via INTRAVENOUS
  Filled 2017-04-24: qty 1

## 2017-04-24 MED ORDER — ENOXAPARIN SODIUM 40 MG/0.4ML ~~LOC~~ SOLN
40.0000 mg | SUBCUTANEOUS | Status: DC
  Administered 2017-04-24: 22:00:00 40 mg via SUBCUTANEOUS
  Filled 2017-04-24: qty 0.4

## 2017-04-24 MED ORDER — ONDANSETRON HCL 4 MG PO TABS
4.0000 mg | ORAL_TABLET | Freq: Four times a day (QID) | ORAL | Status: DC | PRN
  Administered 2017-04-24: 4 mg via ORAL
  Filled 2017-04-24: qty 1

## 2017-04-24 MED ORDER — PANTOPRAZOLE SODIUM 40 MG PO TBEC
40.0000 mg | DELAYED_RELEASE_TABLET | Freq: Every day | ORAL | Status: DC
  Filled 2017-04-24: qty 1

## 2017-04-24 MED ORDER — OXYBUTYNIN CHLORIDE 5 MG PO TABS
5.0000 mg | ORAL_TABLET | Freq: Two times a day (BID) | ORAL | Status: DC
  Administered 2017-04-24 – 2017-04-25 (×3): 5 mg via ORAL
  Filled 2017-04-24 (×3): qty 1

## 2017-04-24 MED ORDER — ADULT MULTIVITAMIN W/MINERALS CH
ORAL_TABLET | Freq: Every day | ORAL | Status: DC
  Administered 2017-04-24 – 2017-04-25 (×2): 1 via ORAL
  Filled 2017-04-24 (×2): qty 1

## 2017-04-24 MED ORDER — MORPHINE SULFATE (PF) 2 MG/ML IV SOLN
2.0000 mg | INTRAVENOUS | Status: DC | PRN
  Administered 2017-04-24 – 2017-04-25 (×4): 2 mg via INTRAVENOUS
  Filled 2017-04-24 (×4): qty 1

## 2017-04-24 MED ORDER — MORPHINE SULFATE ER 30 MG PO TBCR
30.0000 mg | EXTENDED_RELEASE_TABLET | Freq: Two times a day (BID) | ORAL | Status: DC
  Administered 2017-04-24 (×2): 30 mg via ORAL
  Filled 2017-04-24 (×2): qty 1

## 2017-04-24 MED ORDER — SENNOSIDES-DOCUSATE SODIUM 8.6-50 MG PO TABS: 1.0000 | ORAL_TABLET | Freq: Every evening | ORAL | Status: DC | PRN

## 2017-04-24 MED ORDER — SODIUM CHLORIDE 0.9 % IV BOLUS (SEPSIS)
1000.0000 mL | Freq: Once | INTRAVENOUS | Status: AC
  Administered 2017-04-24: 1000 mL via INTRAVENOUS

## 2017-04-24 MED ORDER — LETROZOLE 2.5 MG PO TABS
2.5000 mg | ORAL_TABLET | Freq: Every day | ORAL | Status: DC
  Administered 2017-04-24: 2.5 mg via ORAL
  Filled 2017-04-24 (×2): qty 1

## 2017-04-24 MED ORDER — MORPHINE SULFATE (PF) 2 MG/ML IV SOLN
2.0000 mg | Freq: Once | INTRAVENOUS | Status: AC
  Administered 2017-04-24: 2 mg via INTRAVENOUS
  Filled 2017-04-24: qty 1

## 2017-04-24 MED ORDER — SODIUM CHLORIDE 0.9 % IV SOLN
INTRAVENOUS | Status: DC
  Administered 2017-04-24 (×2): via INTRAVENOUS

## 2017-04-24 MED ORDER — PANTOPRAZOLE SODIUM 40 MG IV SOLR
40.0000 mg | Freq: Every day | INTRAVENOUS | Status: DC
  Administered 2017-04-24 – 2017-04-25 (×2): 40 mg via INTRAVENOUS
  Filled 2017-04-24 (×2): qty 40

## 2017-04-24 MED ORDER — ONDANSETRON HCL 4 MG/2ML IJ SOLN
4.0000 mg | Freq: Four times a day (QID) | INTRAMUSCULAR | Status: DC | PRN
  Filled 2017-04-24: qty 2

## 2017-04-24 MED ORDER — CALCIUM CARBONATE-VITAMIN D 500-200 MG-UNIT PO TABS
2.0000 | ORAL_TABLET | Freq: Every day | ORAL | Status: DC
  Administered 2017-04-24 – 2017-04-25 (×2): 2 via ORAL
  Filled 2017-04-24 (×2): qty 2

## 2017-04-24 MED ORDER — LEVOTHYROXINE SODIUM 100 MCG PO TABS
100.0000 ug | ORAL_TABLET | Freq: Every day | ORAL | Status: DC
  Administered 2017-04-24 – 2017-04-25 (×2): 100 ug via ORAL
  Filled 2017-04-24: qty 1
  Filled 2017-04-24: qty 2
  Filled 2017-04-24: qty 1

## 2017-04-24 MED ORDER — LISINOPRIL 5 MG PO TABS
5.0000 mg | ORAL_TABLET | Freq: Every day | ORAL | Status: DC
  Administered 2017-04-24 – 2017-04-25 (×2): 5 mg via ORAL
  Filled 2017-04-24 (×2): qty 1

## 2017-04-24 MED ORDER — POTASSIUM CHLORIDE CRYS ER 10 MEQ PO TBCR
10.0000 meq | EXTENDED_RELEASE_TABLET | Freq: Every day | ORAL | Status: DC
  Administered 2017-04-24 – 2017-04-25 (×2): 10 meq via ORAL
  Filled 2017-04-24 (×2): qty 1

## 2017-04-24 MED ORDER — OXYCODONE-ACETAMINOPHEN 5-325 MG PO TABS
2.0000 | ORAL_TABLET | Freq: Once | ORAL | Status: AC
  Administered 2017-04-24: 2 via ORAL
  Filled 2017-04-24: qty 2

## 2017-04-24 MED ORDER — PRAVASTATIN SODIUM 20 MG PO TABS
40.0000 mg | ORAL_TABLET | Freq: Every day | ORAL | Status: DC
  Administered 2017-04-24: 22:00:00 40 mg via ORAL
  Filled 2017-04-24: qty 2

## 2017-04-24 MED ORDER — PAROXETINE HCL 30 MG PO TABS
30.0000 mg | ORAL_TABLET | ORAL | Status: DC
  Administered 2017-04-24 – 2017-04-25 (×2): 30 mg via ORAL
  Filled 2017-04-24 (×2): qty 1

## 2017-04-24 MED ORDER — ACETAMINOPHEN 325 MG PO TABS
650.0000 mg | ORAL_TABLET | Freq: Four times a day (QID) | ORAL | Status: DC | PRN
  Administered 2017-04-25: 09:00:00 650 mg via ORAL
  Filled 2017-04-24: qty 2

## 2017-04-24 MED ORDER — ACETAMINOPHEN 650 MG RE SUPP: 650.0000 mg | Freq: Four times a day (QID) | RECTAL | Status: DC | PRN

## 2017-04-24 MED ORDER — GABAPENTIN 600 MG PO TABS
600.0000 mg | ORAL_TABLET | ORAL | Status: DC
  Administered 2017-04-24 – 2017-04-25 (×2): 600 mg via ORAL
  Filled 2017-04-24 (×2): qty 1

## 2017-04-24 MED ORDER — ONDANSETRON HCL 4 MG/2ML IJ SOLN
4.0000 mg | Freq: Once | INTRAMUSCULAR | Status: AC
  Administered 2017-04-24: 4 mg via INTRAVENOUS
  Filled 2017-04-24: qty 2

## 2017-04-24 NOTE — Care Management Obs Status (Signed)
[facility] NOTIFICATION   Patient Details  Name: Hartley Wyke MRN: [id number] Date of Birth: Nov 20, 1940   Medicare Observation Status Notification Given:  Yes. Daughter signed    Shelbie Ammons, RN 04/24/2017, 10:01 AM

## 2017-04-24 NOTE — Progress Notes (Signed)
Admitted this morning for nausea, vomiting, diarrhea and hyponatremia.  Still has some dry heaving so started on Phenergan and she says it is helping.  Sodium improved from 123-129.  Patient denies any stomach pain no further diarrhea. Assessment and plan: Reviewed medications, vitals, lab data and discussed with family. 1.  Hyponatremia secondary to GI losses from nausea, vomiting: Improved with IV fluids, continue IV fluids for another 24 hours, change diet to clear liquids, continue IV Phenergan, Zofran, IV PPI. 2.  History of breast cancer ; on femara Time spent;25 min

## 2017-04-24 NOTE — ED Notes (Signed)
Pt states she has been nauseated since 1900 yesterday. Comes in waves. Took otc antacid without relief. zofran in triage without relief. States she wishes she could just throw up and then she would feel better

## 2017-04-24 NOTE — ED Provider Notes (Signed)
[facility] Emergency Department Provider Note   ____________________________________________   First MD Initiated Contact with Patient 04/24/17 0140     (approximate)  I have reviewed the triage vital signs and the nursing notes.   HISTORY  Chief Complaint Nausea    HPI Sara Greene is a 77 y.o. female who comes into the hospital today with nausea.  The patient states that she went to see her granddaughter to celebrate Christmas as she has been out of the country.  The patient states that she came home and became sweaty and nauseous.  She reports that she has never vomited just a lot of severe nausea.  She had some epigastric and left upper quadrant abdominal pressure but that pain has subsided.  The patient states that the medicine she was given in triage has not helped.  She had 3 bowel movements today but they were formed and solid.  The patient reports that she still nauseous and has never had this before.  She is also had a little bit of lightheadedness.  The patient is here today for evaluation.   Past Medical History:  Diagnosis Date  . Arthritis   . Atrial fibrillation ([place])   . Chronic pain   . Chronic urethral narrowing    please use the smallest foley catheter available for her  . Depression   . Dysrhythmia    a fib. per patient, dr. lithicum could not confirm this diagnosis  . GERD (gastroesophageal reflux disease)   . Hyperlipidemia   . Hypertension   . Hypokalemia   . Hyponatremia   . Hypothyroidism   . Osteoporosis     Patient Active Problem List   Diagnosis Date Noted  . Hyponatremia 04/24/2017  . Closed right hip fracture ([place]) 12/18/2016  . 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 APPENDECTOMY    . BACK SURGERY  1977   no metal in back, laminectomy  . BREAST BIOPSY Left 09/20/2015   PATH PENDING  . BREAST CYST EXCISION Left yrs ago   benign  . CHOLECYSTECTOMY     . COLONOSCOPY    . COLONOSCOPY WITH PROPOFOL N/A 10/15/2014   Procedure: COLONOSCOPY WITH PROPOFOL;  Surgeon: Hulen Luster, MD;  Location: [facility] ENDOSCOPY;  Service: Gastroenterology;  Laterality: N/A;  . DENTAL SURGERY  2016   dental implants to hook dentures on  . FOOT SURGERY Right 2002   screws in right foot  . fracture Rt arm Right 2002   no metal  . INTRAMEDULLARY (IM) NAIL INTERTROCHANTERIC Right 12/19/2016   Procedure: INTRAMEDULLARY (IM) NAIL INTERTROCHANTRIC;  Surgeon: Leim Fabry, MD;  Location: [facility] ORS;  Service: Orthopedics;  Laterality: Right;  . JOINT REPLACEMENT    . ROTATOR CUFF REPAIR Left 1990  . TENDON REPAIR Bilateral   . TOTAL KNEE ARTHROPLASTY Right 07/08/2015   Procedure: TOTAL KNEE ARTHROPLASTY;  Surgeon: Thornton Park, MD;  Location: [facility] ORS;  Service: Orthopedics;  Laterality: Right;    Prior to Admission medications   Medication Sig Start Date End Date Taking? Authorizing Provider  aspirin EC 81 MG tablet Take 81 mg by mouth 2 (two) times daily.   Yes [provider]  calcium-vitamin D (OSCAL WITH D) 500-200 MG-UNIT per tablet Take 2 tablets by mouth daily.    Yes [provider]  doxepin (SINEQUAN) 50 MG capsule Take 50 mg by mouth at bedtime.   Yes [provider]  esomeprazole (  NEXIUM) 20 MG capsule Take 20 mg by mouth every morning.    Yes [provider]  gabapentin (NEURONTIN) 600 MG tablet Take 600 mg by mouth every morning.    Yes [provider]  letrozole (FEMARA) 2.5 MG tablet Take 2.5 mg by mouth at bedtime.   Yes [provider]  levothyroxine (SYNTHROID, LEVOTHROID) 100 MCG tablet Take 100 mcg by mouth daily before breakfast.    Yes [provider]  lisinopril (PRINIVIL,ZESTRIL) 5 MG tablet Take 5 mg by mouth daily.   Yes [provider]  morphine (MS CONTIN) 30 MG 12 hr tablet Take 1 tablet (30 mg total) by mouth every 12 (twelve) hours. 12/22/16  Yes Max Sane, MD    Multiple Vitamins-Minerals (CENTRUM SILVER ADULT 50+ PO) Take 1 tablet by mouth daily.    Yes [provider]  omeprazole (PRILOSEC) 20 MG capsule Take 20 mg by mouth at bedtime as needed.    Yes [provider]  oxybutynin (DITROPAN) 5 MG tablet Take 5 mg by mouth 2 (two) times daily.    Yes [provider]  Oxycodone HCl 10 MG TABS Take 1 tablet (10 mg total) by mouth every 3 (three) hours as needed. 12/22/16  Yes Max Sane, MD  PARoxetine (PAXIL) 30 MG tablet Take 30 mg by mouth every morning.    Yes [provider]  potassium chloride (K-DUR,KLOR-CON) 10 MEQ tablet Take 10 mEq by mouth daily.    Yes [provider]  pravastatin (PRAVACHOL) 40 MG tablet Take 40 mg by mouth at bedtime.    Yes [provider]  aspirin EC 325 MG EC tablet Take 1 tablet (325 mg total) by mouth 2 (two) times daily. Patient not taking: Reported on 04/24/2017 07/12/15   Thornton Park, MD    Allergies Lipitor [atorvastatin] and Shellfish allergy  Family History  Problem Relation Age of Onset  . Diabetes Mother   . Hypertension Mother   . Stroke Father   . Hypertension Father   . Diabetes Father   . Cancer Father   . Diabetes Sister   . Hypertension Sister     Social History Social History   Tobacco Use  . Smoking status: Never Smoker  . Smokeless tobacco: Never Used  Substance Use Topics  . Alcohol use: No  . Drug use: No    Review of Systems  Constitutional: No fever/chills Eyes: No visual changes. ENT: No sore throat. Cardiovascular: Denies chest pain. Respiratory: Denies shortness of breath. Gastrointestinal: Nausea and abdominal pressure, no abdominal pain.  no vomiting.  No diarrhea.  No constipation. Genitourinary: Negative for dysuria. Musculoskeletal: Negative for back pain. Skin: Negative for rash. Neurological: Negative for headaches, focal weakness or numbness.   ____________________________________________   PHYSICAL  EXAM:  VITAL SIGNS: ED Triage Vitals [04/23/17 2245]  Enc Vitals Group     BP (!) 156/71     Pulse Rate 74     Resp 20     Temp 98.1 F (36.7 C)     Temp Source Oral     SpO2 100 %     Weight 172 lb (78 kg)     Height 5\' 4"  (1.626 m)     Head Circumference      Peak Flow      Pain Score      Pain Loc      Pain Edu?      Excl. in [place]?     Constitutional: Alert and oriented. Well  appearing and in mild distress. Eyes: Conjunctivae are normal. PERRL. EOMI. Head: Atraumatic. Nose: No congestion/rhinnorhea. Mouth/Throat: Mucous membranes are moist.  Oropharynx non-erythematous. Cardiovascular: Normal rate, regular rhythm. Grossly normal heart sounds.  Good peripheral circulation. Respiratory: Normal respiratory effort.  No retractions. Lungs CTAB. Gastrointestinal: Soft and nontender. No distention.  Positive bowel sounds Musculoskeletal: No lower extremity tenderness nor edema. . Neurologic:  Normal speech and language.  Skin:  Skin is warm, dry and intact.  Psychiatric: Mood and affect are normal.   ____________________________________________   LABS (all labs ordered are listed, but only abnormal results are displayed)  Labs Reviewed  COMPREHENSIVE METABOLIC PANEL - Abnormal; Notable for the following components:      Result Value   Sodium 123 (*)    Chloride 89 (*)    Glucose, Bld 120 (*)    ALT 13 (*)    Alkaline Phosphatase 132 (*)    GFR calc non Af Amer 54 (*)    All other components within normal limits  CBC - Abnormal; Notable for the following components:   RDW 16.0 (*)    All other components within normal limits  URINALYSIS, COMPLETE (UACMP) WITH MICROSCOPIC - Abnormal; Notable for the following components:   Color, Urine STRAW (*)    APPearance CLEAR (*)    Leukocytes, UA TRACE (*)    Squamous Epithelial / LPF 0-5 (*)    All other components within normal limits  BASIC METABOLIC PANEL - Abnormal; Notable for the following components:   Sodium 129 (*)     Chloride 98 (*)    CO2 20 (*)    Glucose, Bld 103 (*)    Calcium 8.7 (*)    All other components within normal limits  LIPASE, BLOOD  TROPONIN I  TROPONIN I   ____________________________________________  EKG  ED ECG REPORT I, Loney Hering, the attending physician, personally viewed and interpreted this ECG.   Date: 04/23/2017  EKG Time: 2255  Rate: 72  Rhythm: normal sinus rhythm  Axis: normal  Intervals:none  ST&T Change: none  ____________________________________________  RADIOLOGY  ED MD interpretation:  *none  Official radiology report(s): No results found.  ____________________________________________   PROCEDURES  Procedure(s) performed: None  Procedures  Critical Care performed: No  ____________________________________________   INITIAL IMPRESSION / ASSESSMENT AND PLAN / ED COURSE  As part of my medical decision making, I reviewed the following data within the electronic MEDICAL RECORD NUMBER Notes from prior ED visits and [place] Controlled Substance Database   This is a 77 year old female who comes into the hospital today with some nausea and abdominal pressure.  My differential diagnosis includes acute coronary syndrome, pancreatitis, gastritis, gastroenteritis.  We did check some blood work on the patient.  The patient CBC was unremarkable but her CMP showed a sodium of 123.  The patient's urinalysis was negative, lipase was negative and troponins x2 were negative.  I did give the patient a dose of IV Zofran as well as a liter of normal saline.  I feel that some of the patient's symptoms may be due to her hyponatremia.  The patient will be admitted to the hospitalist service for further evaluation and treatment of her hyponatremia and nausea.      ____________________________________________   FINAL CLINICAL IMPRESSION(S) / ED DIAGNOSES  Final diagnoses:  Nausea  Hyponatremia     ED Discharge Orders    None       Note:  This  document was prepared using Dragon voice  recognition software and may include unintentional dictation errors.    Loney Hering, MD 04/24/17 [phone]

## 2017-04-24 NOTE — H&P (Signed)
Sara Greene NAME: Sara Greene    MR#:  [id number]  DATE OF BIRTH:  01-30-1941  DATE OF ADMISSION:  04/24/2017  PRIMARY CARE PHYSICIAN: Dion Body, MD   REQUESTING/REFERRING PHYSICIAN:   CHIEF COMPLAINT:   Chief Complaint  Patient presents with  . Nausea    HISTORY OF PRESENT ILLNESS: Sara Greene  is a 77 y.o. female with a known history of atrial fibrillation, GERD, hypertension, hyperlipidemia, hypokalemia, hyponatremia, osteoporosis presented to the emergency room with nausea.  Patient had 3 bowel movements yesterday.  No complaints of any abdominal pain.  Patient was evaluated in the emergency room her sodium level was low around 123.  No fever chills and cough.  No complaints of any chest pain, shortness of breath.  Hospitalist service was consulted for further care.  PAST MEDICAL HISTORY:   Past Medical History:  Diagnosis Date  . Arthritis   . Atrial fibrillation ([place])   . Chronic pain   . Chronic urethral narrowing    please use the smallest foley catheter available for her  . Depression   . Dysrhythmia    a fib. per patient, dr. lithicum could not confirm this diagnosis  . GERD (gastroesophageal reflux disease)   . Hyperlipidemia   . Hypertension   . Hypokalemia   . Hyponatremia   . Hypothyroidism   . Osteoporosis     PAST SURGICAL HISTORY:  Past Surgical History:  Procedure Laterality Date  . APPENDECTOMY    . BACK SURGERY  1977   no metal in back, laminectomy  . BREAST BIOPSY Left 09/20/2015   PATH PENDING  . BREAST CYST EXCISION Left yrs ago   benign  . CHOLECYSTECTOMY    . COLONOSCOPY    . COLONOSCOPY WITH PROPOFOL N/A 10/15/2014   Procedure: COLONOSCOPY WITH PROPOFOL;  Surgeon: Hulen Luster, MD;  Location: [facility] ENDOSCOPY;  Service: Gastroenterology;  Laterality: N/A;  . DENTAL SURGERY  2016   dental implants to hook dentures on  . FOOT SURGERY Right 2002   screws in right foot  . fracture  Rt arm Right 2002   no metal  . INTRAMEDULLARY (IM) NAIL INTERTROCHANTERIC Right 12/19/2016   Procedure: INTRAMEDULLARY (IM) NAIL INTERTROCHANTRIC;  Surgeon: Leim Fabry, MD;  Location: [facility] ORS;  Service: Orthopedics;  Laterality: Right;  . JOINT REPLACEMENT    . ROTATOR CUFF REPAIR Left 1990  . TENDON REPAIR Bilateral   . TOTAL KNEE ARTHROPLASTY Right 07/08/2015   Procedure: TOTAL KNEE ARTHROPLASTY;  Surgeon: Thornton Park, MD;  Location: [facility] ORS;  Service: Orthopedics;  Laterality: Right;    SOCIAL HISTORY:  Social History   Tobacco Use  . Smoking status: Never Smoker  . Smokeless tobacco: Never Used  Substance Use Topics  . Alcohol use: No    FAMILY HISTORY:  Family History  Problem Relation Age of Onset  . Diabetes Mother   . Hypertension Mother   . Stroke Father   . Hypertension Father   . Diabetes Father   . Cancer Father   . Diabetes Sister   . Hypertension Sister     DRUG ALLERGIES:  Allergies  Allergen Reactions  . Lipitor [Atorvastatin] Other (See Comments)    lupus  . Shellfish Allergy Nausea Only    No problems with topical betadine    REVIEW OF SYSTEMS:   CONSTITUTIONAL: No fever, fatigue or weakness.  EYES: No blurred or double vision.  EARS, NOSE, AND THROAT: No tinnitus  or ear pain.  RESPIRATORY: No cough, shortness of breath, wheezing or hemoptysis.  CARDIOVASCULAR: No chest pain, orthopnea, edema.  GASTROINTESTINAL: Has nausea, no vomiting, diarrhea or abdominal pain.  GENITOURINARY: No dysuria, hematuria.  ENDOCRINE: No polyuria, nocturia,  HEMATOLOGY: No anemia, easy bruising or bleeding SKIN: No rash or lesion. MUSCULOSKELETAL: No joint pain or arthritis.   NEUROLOGIC: No tingling, numbness, weakness.  PSYCHIATRY: No anxiety or depression.   MEDICATIONS AT HOME:  Prior to Admission medications   Medication Sig Start Date End Date Taking? Authorizing Provider  aspirin EC 81 MG tablet Take 81 mg by mouth 2 (two) times daily.   Yes  [provider]  calcium-vitamin D (OSCAL WITH D) 500-200 MG-UNIT per tablet Take 2 tablets by mouth daily.    Yes [provider]  doxepin (SINEQUAN) 50 MG capsule Take 50 mg by mouth at bedtime.   Yes [provider]  esomeprazole (NEXIUM) 20 MG capsule Take 20 mg by mouth every morning.    Yes [provider]  gabapentin (NEURONTIN) 600 MG tablet Take 600 mg by mouth every morning.    Yes [provider]  letrozole (FEMARA) 2.5 MG tablet Take 2.5 mg by mouth at bedtime.   Yes [provider]  levothyroxine (SYNTHROID, LEVOTHROID) 100 MCG tablet Take 100 mcg by mouth daily before breakfast.    Yes [provider]  lisinopril (PRINIVIL,ZESTRIL) 5 MG tablet Take 5 mg by mouth daily.   Yes [provider]  morphine (MS CONTIN) 30 MG 12 hr tablet Take 1 tablet (30 mg total) by mouth every 12 (twelve) hours. 12/22/16  Yes Max Sane, MD  Multiple Vitamins-Minerals (CENTRUM SILVER ADULT 50+ PO) Take 1 tablet by mouth daily.    Yes [provider]  omeprazole (PRILOSEC) 20 MG capsule Take 20 mg by mouth at bedtime as needed.    Yes [provider]  oxybutynin (DITROPAN) 5 MG tablet Take 5 mg by mouth 2 (two) times daily.    Yes [provider]  Oxycodone HCl 10 MG TABS Take 1 tablet (10 mg total) by mouth every 3 (three) hours as needed. 12/22/16  Yes Max Sane, MD  PARoxetine (PAXIL) 30 MG tablet Take 30 mg by mouth every morning.    Yes [provider]  potassium chloride (K-DUR,KLOR-CON) 10 MEQ tablet Take 10 mEq by mouth daily.    Yes [provider]  pravastatin (PRAVACHOL) 40 MG tablet Take 40 mg by mouth at bedtime.    Yes [provider]  aspirin EC 325 MG EC tablet Take 1 tablet (325 mg total) by mouth 2 (two) times daily. Patient not taking: Reported on 04/24/2017 07/12/15   Thornton Park, MD      PHYSICAL EXAMINATION:   VITAL SIGNS: Blood pressure (!) 155/67,  pulse 73, temperature 98.1 F (36.7 C), temperature source Oral, resp. rate 15, height 5\' 4"  (1.626 m), weight 78 kg (172 lb), SpO2 100 %.  GENERAL:  77 y.o.-year-old patient lying in the bed with no acute distress.  EYES: Pupils equal, round, reactive to light and accommodation. No scleral icterus. Extraocular muscles intact.  HEENT: Head atraumatic, normocephalic. Oropharynx dry and nasopharynx clear.  NECK:  Supple, no jugular venous distention. No thyroid enlargement, no tenderness.  LUNGS: Normal breath sounds bilaterally, no wheezing, rales,rhonchi or crepitation. No use of accessory muscles of respiration.  CARDIOVASCULAR: S1, S2 normal. No murmurs, rubs, or gallops.  ABDOMEN: Soft, nontender, nondistended. Bowel sounds present. No organomegaly or mass.  EXTREMITIES: No pedal edema, cyanosis, or clubbing.  NEUROLOGIC: Cranial nerves II through XII are intact. Muscle strength 5/5 in all extremities. Sensation intact. Gait not checked.  PSYCHIATRIC: The patient is alert and oriented x 3.  SKIN: No obvious rash, lesion, or ulcer.   LABORATORY PANEL:   CBC Recent Labs  Lab 04/23/17 2252  WBC 5.9  HGB 12.2  HCT 35.8  PLT 234  MCV 80.9  MCH 27.6  MCHC 34.1  RDW 16.0*   ------------------------------------------------------------------------------------------------------------------  Chemistries  Recent Labs  Lab 04/23/17 2252  NA 123*  K 4.2  CL 89*  CO2 22  GLUCOSE 120*  BUN 11  CREATININE 0.98  CALCIUM 9.1  AST 33  ALT 13*  ALKPHOS 132*  BILITOT 0.5   ------------------------------------------------------------------------------------------------------------------ estimated creatinine clearance is 48.6 mL/min (by C-G formula based on SCr of 0.98 mg/dL). ------------------------------------------------------------------------------------------------------------------ No results for input(s): TSH, T4TOTAL, T3FREE, THYROIDAB in the last 72 hours.  Invalid  input(s): FREET3   Coagulation profile No results for input(s): INR, PROTIME in the last 168 hours. ------------------------------------------------------------------------------------------------------------------- No results for input(s): DDIMER in the last 72 hours. -------------------------------------------------------------------------------------------------------------------  Cardiac Enzymes Recent Labs  Lab 04/23/17 2252 04/24/17 0214  TROPONINI <0.03 <0.03   ------------------------------------------------------------------------------------------------------------------ Invalid input(s): POCBNP  ---------------------------------------------------------------------------------------------------------------  Urinalysis    Component Value Date/Time   COLORURINE STRAW (A) 04/23/2017 2252   APPEARANCEUR CLEAR (A) 04/23/2017 2252   APPEARANCEUR Clear 08/20/2013 2103   LABSPEC 1.005 04/23/2017 2252   LABSPEC 1.016 08/20/2013 2103   PHURINE 7.0 04/23/2017 2252   GLUCOSEU NEGATIVE 04/23/2017 2252   GLUCOSEU Negative 08/20/2013 2103   HGBUR NEGATIVE 04/23/2017 2252   BILIRUBINUR NEGATIVE 04/23/2017 2252   BILIRUBINUR Negative 08/20/2013 2103   KETONESUR NEGATIVE 04/23/2017 2252   PROTEINUR NEGATIVE 04/23/2017 2252   NITRITE NEGATIVE 04/23/2017 2252   LEUKOCYTESUR TRACE (A) 04/23/2017 2252   LEUKOCYTESUR Negative 08/20/2013 2103     RADIOLOGY: No results found.  EKG: Orders placed or performed during the hospital encounter of 04/24/17  . ED EKG  . ED EKG    IMPRESSION AND PLAN: 77 year old female patient with history of arthritis, atrial ablation, hypertension, hyponatremia and hypokalemia presented to the emergency room with nausea.  Admitting diagnosis 1.  Hyponatremia 2.  Intractable nausea 3.  Hypertension 4.  Hyperlipidemia Treatment plan Admit patient to medical floor IV fluid hydration Antiemetics Follow-up electrolytes  All the records are  reviewed and case discussed with ED provider. Management plans discussed with the patient, family and they are in agreement.  CODE STATUS:FULL CODE Code Status History    Date Active Date Inactive Code Status Order ID Comments User Context   12/18/2016 21:10 12/22/2016 18:59 Full Code [id number]  Demetrios Loll, MD Inpatient       TOTAL TIME TAKING CARE OF THIS PATIENT: 50 minutes.    Saundra Shelling M.D on 04/24/2017 at 3:44 AM  Between 7am to 6pm - Pager - [phone]  After 6pm go to [email or web address] - password [email or web address] [place] [facility]  Office  [phone]  CC: Primary care physician; Dion Body, MD

## 2017-04-24 NOTE — Care Management Note (Signed)
Case Management Note  Patient Details  Name: Sara Greene MRN: [id number] Date of Birth: 1940/07/22  Subjective/Objective:                  Admitted to [facility] with the diagnosis of hyponatremia. Lives with husband, Hal, who is hearing impaired, Daughter is Edgar Corrigan [phone]). Prescriptions are filled at [facility] in [place]. No home Health. [employer] last October following surgery, No home oxygen. One hoover about and one rolling walker in the home. Bedside commode, raised toilet seat, and shower bench in the home. Takes care of all basic activities of daily living herself, drives. Last fall was in October 2018. Good appetite until this admission . Family will transport  Action/Plan: Will continue to follow for discharge plans   Expected Discharge Date:                  Expected Discharge Plan:     In-House Referral:     Discharge planning Services     Post Acute Care Choice:    Choice offered to:     DME Arranged:    DME Agency:     HH Arranged:    [place] Agency:     Status of Service:     If discussed at [employer], dates discussed:    Additional Comments:  Shelbie Ammons, RN MSN CCM Care Management [phone] 04/24/2017, 10:01 AM

## 2017-04-25 LAB — BASIC METABOLIC PANEL
Anion gap: 8 (ref 5–15)
BUN: 6 mg/dL (ref 6–20)
CALCIUM: 8.4 mg/dL — AB (ref 8.9–10.3)
CO2: 22 mmol/L (ref 22–32)
CREATININE: 0.77 mg/dL (ref 0.44–1.00)
Chloride: 105 mmol/L (ref 101–111)
Glucose, Bld: 91 mg/dL (ref 65–99)
Potassium: 3.7 mmol/L (ref 3.5–5.1)
SODIUM: 135 mmol/L (ref 135–145)

## 2017-04-25 MED ORDER — ONDANSETRON 4 MG PO TBDP: 4.0000 mg | ORAL_TABLET | Freq: Three times a day (TID) | ORAL | 0 refills | Status: AC | PRN

## 2017-04-25 NOTE — Plan of Care (Signed)
VSS, free of falls during shift.  Reported R hip pain 4-8/10, improved w/ PRN IV Morphine 2mg  + scheduled PO MSContin 30mg .  No other complaints overnight.  Bed in low position, call bell within reach.  Daughter at bedside, [facility]

## 2017-04-25 NOTE — Discharge Summary (Signed)
Sara Greene, is a 77 y.o. female  DOB [birth year]  MRN [id number].  Admission date:  04/24/2017  Admitting Physician  Saundra Shelling, MD  Discharge Date:  04/25/2017   Primary MD  Dion Body, MD  Recommendations for primary care physician for things to follow:   Follow-up with PCP in 1 week   Admission Diagnosis  Hyponatremia [E87.1] Nausea [R11.0]   Discharge Diagnosis  Hyponatremia [E87.1] Nausea [R11.0]    Active Problems:   Hyponatremia      Past Medical History:  Diagnosis Date  . Arthritis   . Atrial fibrillation ([place])   . Chronic pain   . Chronic urethral narrowing    please use the smallest foley catheter available for her  . Depression   . Dysrhythmia    a fib. per patient, dr. lithicum could not confirm this diagnosis  . GERD (gastroesophageal reflux disease)   . Hyperlipidemia   . Hypertension   . Hypokalemia   . Hyponatremia   . Hypothyroidism   . Osteoporosis     Past Surgical History:  Procedure Laterality Date  . APPENDECTOMY    . BACK SURGERY  1977   no metal in back, laminectomy  . BREAST BIOPSY Left 09/20/2015   PATH PENDING  . BREAST CYST EXCISION Left yrs ago   benign  . CHOLECYSTECTOMY    . COLONOSCOPY    . COLONOSCOPY WITH PROPOFOL N/A 10/15/2014   Procedure: COLONOSCOPY WITH PROPOFOL;  Surgeon: Hulen Luster, MD;  Location: [facility] ENDOSCOPY;  Service: Gastroenterology;  Laterality: N/A;  . DENTAL SURGERY  2016   dental implants to hook dentures on  . FOOT SURGERY Right 2002   screws in right foot  . fracture Rt arm Right 2002   no metal  . INTRAMEDULLARY (IM) NAIL INTERTROCHANTERIC Right 12/19/2016   Procedure: INTRAMEDULLARY (IM) NAIL INTERTROCHANTRIC;  Surgeon: Leim Fabry, MD;  Location: [facility] ORS;  Service: Orthopedics;  Laterality: Right;  . JOINT REPLACEMENT    .  ROTATOR CUFF REPAIR Left 1990  . TENDON REPAIR Bilateral   . TOTAL KNEE ARTHROPLASTY Right 07/08/2015   Procedure: TOTAL KNEE ARTHROPLASTY;  Surgeon: Thornton Park, MD;  Location: [facility] ORS;  Service: Orthopedics;  Laterality: Right;       History of present illness and  Hospital Course:     Kindly see H&P for history of present illness and admission details, please review complete Labs, Consult reports and Test reports for all details in brief  HPI  from the history and physical done on the day of admission  77 year old female patient came in because of intractable nausea, vomiting, diarrhea, admitted for severe hyponatremia with sodium of 123 /  Hospital Course  #1 acute hyponatremia secondary to dehydration with acute GI infection: Received IV fluids with normal saline, sodium improved from 123 to 135.  Patient had no further nausea.  We can stop the IV fluids and discharge the patient today. 2.  Intractable nausea, vomiting secondary to acute gastroenteritis with viral infection: Received IV fluids, conservative treatment with Zofran, Phenergan.  Patient nausea vomiting improved with PPIs, Zofran, Phenergan.  Tolerating the diet.  She can go home with limited prescription of Zofran just in case and also she has a oral PPIs that she can continue. 3.  History of breast cancer: Patient is on Femara.  Before arthritis, chronic back pain: Patient takes pain medicine with MS Contin, Percocet.  Continue them. 4.  Chronic atrial fibrillation, rate controlled, she takes aspirin 325 mg  p.o. daily.  Discharge Condition: stable   Follow UP      Discharge Instructions  and  Discharge Medications      Allergies as of 04/25/2017      Reactions   Lipitor [atorvastatin] Other (See Comments)   lupus   Shellfish Allergy Nausea Only   No problems with topical betadine      Medication List    TAKE these medications   aspirin EC 81 MG tablet Take 81 mg by mouth 2 (two) times  daily. What changed:  Another medication with the same name was removed. Continue taking this medication, and follow the directions you see here.   calcium-vitamin D 500-200 MG-UNIT tablet Commonly known as:  OSCAL WITH D Take 2 tablets by mouth daily.   CENTRUM SILVER ADULT 50+ PO Take 1 tablet by mouth daily.   doxepin 50 MG capsule Commonly known as:  SINEQUAN Take 50 mg by mouth at bedtime.   esomeprazole 20 MG capsule Commonly known as:  NEXIUM Take 20 mg by mouth every morning.   gabapentin 600 MG tablet Commonly known as:  NEURONTIN Take 600 mg by mouth every morning.   letrozole 2.5 MG tablet Commonly known as:  FEMARA Take 2.5 mg by mouth at bedtime.   levothyroxine 100 MCG tablet Commonly known as:  SYNTHROID, LEVOTHROID Take 100 mcg by mouth daily before breakfast.   lisinopril 5 MG tablet Commonly known as:  PRINIVIL,ZESTRIL Take 5 mg by mouth daily.   morphine 30 MG 12 hr tablet Commonly known as:  MS CONTIN Take 1 tablet (30 mg total) by mouth every 12 (twelve) hours.   ondansetron 4 MG disintegrating tablet Commonly known as:  ZOFRAN ODT Take 1 tablet (4 mg total) by mouth every 8 (eight) hours as needed for nausea or vomiting.   oxybutynin 5 MG tablet Commonly known as:  DITROPAN Take 5 mg by mouth 2 (two) times daily.   Oxycodone HCl 10 MG Tabs Take 1 tablet (10 mg total) by mouth every 3 (three) hours as needed.   PARoxetine 30 MG tablet Commonly known as:  PAXIL Take 30 mg by mouth every morning.   potassium chloride 10 MEQ tablet Commonly known as:  K-DUR,KLOR-CON Take 10 mEq by mouth daily.   pravastatin 40 MG tablet Commonly known as:  PRAVACHOL Take 40 mg by mouth at bedtime.   PRILOSEC 20 MG capsule Generic drug:  omeprazole Take 20 mg by mouth at bedtime as needed.         Diet and Activity recommendation: See Discharge Instructions above   Consults obtained -none   Major procedures and Radiology Reports - PLEASE  review detailed and final reports for all details, in brief -     No results found.  Micro Results     No results found for this or any previous visit (from the past 240 hour(s)).     Today   Subjective:   Sara Greene today has no headache,no chest abdominal pain,no new weakness tingling or numbness, feels much better wants to go home today.   Objective:   Blood pressure (!) 166/67, pulse 63, temperature 97.7 F (36.5 C), temperature source Oral, resp. rate 18, height 5' 4.5" (1.638 m), weight 84.2 kg (185 lb 9.6 oz), SpO2 100 %.   Intake/Output Summary (Last 24 hours) at 04/25/2017 1434 Last data filed at 04/25/2017 1026 Gross per 24 hour  Intake 2373 ml  Output -  Net 2373 ml    Exam Awake  Alert, Oriented x 3, No new F.N deficits, Normal affect [place].AT,PERRAL Supple Neck,No JVD, No cervical lymphadenopathy appriciated.  Symmetrical Chest wall movement, Good air movement bilaterally, CTAB RRR,No Gallops,Rubs or new Murmurs, No Parasternal Heave +ve B.Sounds, Abd Soft, Non tender, No organomegaly appriciated, No rebound -guarding or rigidity. No Cyanosis, Clubbing or edema, No new Rash or bruise  Data Review   CBC w Diff:  Lab Results  Component Value Date   WBC 5.9 04/23/2017   HGB 12.2 04/23/2017   HGB 12.6 08/21/2013   HCT 35.8 04/23/2017   HCT 39.0 08/21/2013   PLT 234 04/23/2017   PLT 187 08/21/2013   LYMPHOPCT 20 07/20/2015   LYMPHOPCT 5.1 08/21/2013   MONOPCT 8 07/20/2015   MONOPCT 6.3 08/21/2013   EOSPCT 2 07/20/2015   EOSPCT 0.0 08/21/2013   BASOPCT 0 07/20/2015   BASOPCT 0.3 08/21/2013    CMP:  Lab Results  Component Value Date   NA 135 04/25/2017   NA 133 (L) 08/21/2013   K 3.7 04/25/2017   K 3.7 08/21/2013   CL 105 04/25/2017   CL 103 08/21/2013   CO2 22 04/25/2017   CO2 23 08/21/2013   BUN 6 04/25/2017   BUN 15 08/21/2013   CREATININE 0.77 04/25/2017   CREATININE 1.30 08/21/2013   PROT 7.5 04/23/2017   PROT 7.2 08/20/2013    ALBUMIN 4.3 04/23/2017   ALBUMIN 3.4 08/20/2013   BILITOT 0.5 04/23/2017   BILITOT 0.6 08/20/2013   ALKPHOS 132 (H) 04/23/2017   ALKPHOS 89 08/20/2013   AST 33 04/23/2017   AST 31 08/20/2013   ALT 13 (L) 04/23/2017   ALT 29 08/20/2013  .   Total Time in preparing paper work, data evaluation and todays exam - 26 minutes  Epifanio Lesches M.D on 04/25/2017 at 2:34 PM    Note: This dictation was prepared with Dragon dictation along with smaller phrase technology. Any transcriptional errors that result from this process are unintentional.

## 2017-04-25 NOTE — Plan of Care (Signed)
MD making rounds. Order received to discharge home. IV removed. Prescription E-scribed to pharmacy. Discharge paperwork provided, explained, signed and witnessed. No unanswered questions. Discharged via wheelchair with volunteer services. Belongings sent with patient and family.

## 2017-05-17 ENCOUNTER — Inpatient Hospital Stay: Admission: RE | Admit: 2017-05-17 | Payer: Medicare Other | Source: Ambulatory Visit

## 2017-05-18 ENCOUNTER — Other Ambulatory Visit: Payer: Self-pay

## 2017-05-18 ENCOUNTER — Encounter
Admission: RE | Admit: 2017-05-18 | Discharge: 2017-05-18 | Disposition: A | Discharge status: Home / Self Care | Payer: Medicare Other | Source: Ambulatory Visit | Attending: Orthopedic Surgery | Admitting: Orthopedic Surgery

## 2017-05-18 DIAGNOSIS — Z01818 Encounter for other preprocedural examination: Secondary | ICD-10-CM | POA: Insufficient documentation

## 2017-05-18 DIAGNOSIS — G5601 Carpal tunnel syndrome, right upper limb: Secondary | ICD-10-CM | POA: Insufficient documentation

## 2017-05-18 HISTORY — DX: Malignant (primary) neoplasm, unspecified: C80.1

## 2017-05-18 HISTORY — DX: Anemia, unspecified: D64.9

## 2017-05-18 NOTE — Patient Instructions (Signed)
Your procedure is scheduled on: Thursday 05/24/17 Report to [place]. To find out your arrival time please call [phone] between 1PM - 3PM on Wednesday 05/23/17.  Remember: Instructions that are not followed completely may result in serious medical risk, up to and including death, or upon the discretion of your surgeon and anesthesiologist your surgery may need to be rescheduled.     _X__ 1. Do not eat food after midnight the night before your procedure.                 No gum chewing or hard candies. You may drink clear liquids up to 2 hours                 before you are scheduled to arrive for your surgery- DO not drink clear                 liquids within 2 hours of the start of your surgery.                 Clear Liquids include:  water, apple juice without pulp, clear carbohydrate                 drink such as Clearfast or Gatorade, Black Coffee or Tea (Do not add                 anything to coffee or tea).  __X__2.  On the morning of surgery brush your teeth with toothpaste and water, you                 may rinse your mouth with mouthwash if you wish.  Do not swallow any              toothpaste of mouthwash.     _X__ 3.  No Alcohol for 24 hours before or after surgery.   _X__ 4.  Do Not Smoke or use e-cigarettes For 24 Hours Prior to Your Surgery.                 Do not use any chewable tobacco products for at least 6 hours prior to                 surgery.  ____  5.  Bring all medications with you on the day of surgery if instructed.   __X__  6.  Notify your doctor if there is any change in your medical condition      (cold, fever, infections).     Do not wear jewelry, make-up, hairpins, clips or nail polish. Do not wear lotions, powders, or perfumes.  Do not shave 48 hours prior to surgery. Men may shave face and neck. Do not bring valuables to the hospital.    [facility] is not responsible for any belongings or  valuables.  Contacts, dentures/partials or body piercings may not be worn into surgery. Bring a case for your contacts, glasses or hearing aids, a denture cup will be supplied. Leave your suitcase in the car. After surgery it may be brought to your room. For patients admitted to the hospital, discharge time is determined by your treatment team.   Patients discharged the day of surgery will not be allowed to drive home.   Please read over the following fact sheets that you were given:   MRSA Information  __X__ Take these medicines the morning of surgery with A SIP OF WATER:  1. [place]  2. GABAPENTIN  3. LEVOTHYROXINE  4. MORPHINE  5. OXYBUTININ  6. PAROXETINE  ____ Fleet Enema (as directed)   __X__ Use CHG Soap/SAGE wipes as directed  ____ Use inhalers on the day of surgery  ____ Stop metformin/Janumet/Farxiga 2 days prior to surgery    ____ Take 1/2 of usual insulin dose the night before surgery. No insulin the morning          of surgery.   __X__ Stop Blood Thinners Coumadin/Plavix/Xarelto/Pleta/Pradaxa/Eliquis/Effient/Aspirin  on   Or contact your Surgeon, Cardiologist or Medical Doctor regarding  ability to stop your blood thinners  __X__ Stop Anti-inflammatories 7 days before surgery such as Advil, Ibuprofen, Motrin,  BC or Goodies Powder, Naprosyn, Naproxen, Aleve, Aspirin    __X__ Stopall herbal supplements, fish oil or vitamin E until after surgery.    ____ Bring C-Pap to the hospital.

## 2017-05-24 ENCOUNTER — Ambulatory Visit: Payer: Medicare Other | Admitting: Anesthesiology

## 2017-05-24 ENCOUNTER — Encounter
Admission: RE | Disposition: A | Discharge status: Home / Self Care | Payer: Self-pay | Source: Ambulatory Visit | Attending: Orthopedic Surgery

## 2017-05-24 ENCOUNTER — Other Ambulatory Visit: Payer: Self-pay

## 2017-05-24 ENCOUNTER — Ambulatory Visit
Admission: RE | Admit: 2017-05-24 | Discharge: 2017-05-24 | Disposition: A | Discharge status: Home / Self Care | Payer: Medicare Other | Source: Ambulatory Visit | Attending: Orthopedic Surgery | Admitting: Orthopedic Surgery

## 2017-05-24 ENCOUNTER — Encounter: Payer: Self-pay | Admitting: *Deleted

## 2017-05-24 DIAGNOSIS — Z853 Personal history of malignant neoplasm of breast: Secondary | ICD-10-CM | POA: Insufficient documentation

## 2017-05-24 DIAGNOSIS — I4891 Unspecified atrial fibrillation: Secondary | ICD-10-CM | POA: Insufficient documentation

## 2017-05-24 DIAGNOSIS — I10 Essential (primary) hypertension: Secondary | ICD-10-CM | POA: Insufficient documentation

## 2017-05-24 DIAGNOSIS — K219 Gastro-esophageal reflux disease without esophagitis: Secondary | ICD-10-CM | POA: Diagnosis not present

## 2017-05-24 DIAGNOSIS — Z96651 Presence of right artificial knee joint: Secondary | ICD-10-CM | POA: Insufficient documentation

## 2017-05-24 DIAGNOSIS — Z7983 Long term (current) use of bisphosphonates: Secondary | ICD-10-CM | POA: Insufficient documentation

## 2017-05-24 DIAGNOSIS — E559 Vitamin D deficiency, unspecified: Secondary | ICD-10-CM | POA: Insufficient documentation

## 2017-05-24 DIAGNOSIS — M810 Age-related osteoporosis without current pathological fracture: Secondary | ICD-10-CM | POA: Insufficient documentation

## 2017-05-24 DIAGNOSIS — G5601 Carpal tunnel syndrome, right upper limb: Secondary | ICD-10-CM | POA: Diagnosis not present

## 2017-05-24 DIAGNOSIS — Z7982 Long term (current) use of aspirin: Secondary | ICD-10-CM | POA: Insufficient documentation

## 2017-05-24 DIAGNOSIS — E039 Hypothyroidism, unspecified: Secondary | ICD-10-CM | POA: Diagnosis not present

## 2017-05-24 DIAGNOSIS — Z79811 Long term (current) use of aromatase inhibitors: Secondary | ICD-10-CM | POA: Diagnosis not present

## 2017-05-24 DIAGNOSIS — E785 Hyperlipidemia, unspecified: Secondary | ICD-10-CM | POA: Insufficient documentation

## 2017-05-24 DIAGNOSIS — F329 Major depressive disorder, single episode, unspecified: Secondary | ICD-10-CM | POA: Insufficient documentation

## 2017-05-24 DIAGNOSIS — Z79891 Long term (current) use of opiate analgesic: Secondary | ICD-10-CM | POA: Diagnosis not present

## 2017-05-24 HISTORY — PX: CARPAL TUNNEL RELEASE: SHX101

## 2017-05-24 SURGERY — CARPAL TUNNEL RELEASE
Anesthesia: General | Site: Wrist | Laterality: Right | Wound class: Clean

## 2017-05-24 MED ORDER — ONDANSETRON HCL 4 MG/2ML IJ SOLN
INTRAMUSCULAR | Status: DC | PRN
  Administered 2017-05-24: 4 mg via INTRAVENOUS

## 2017-05-24 MED ORDER — LACTATED RINGERS IV SOLN
INTRAVENOUS | Status: DC
  Administered 2017-05-24: 13:00:00 via INTRAVENOUS

## 2017-05-24 MED ORDER — LIDOCAINE HCL (CARDIAC) 20 MG/ML IV SOLN
INTRAVENOUS | Status: DC | PRN
  Administered 2017-05-24: 60 mg via INTRAVENOUS

## 2017-05-24 MED ORDER — PROPOFOL 10 MG/ML IV BOLUS
INTRAVENOUS | Status: AC
  Filled 2017-05-24: qty 20

## 2017-05-24 MED ORDER — BUPIVACAINE HCL 0.5 % IJ SOLN
INTRAMUSCULAR | Status: DC | PRN
  Administered 2017-05-24: 10 mL

## 2017-05-24 MED ORDER — PROPOFOL 10 MG/ML IV BOLUS
INTRAVENOUS | Status: DC | PRN
  Administered 2017-05-24: 120 mg via INTRAVENOUS

## 2017-05-24 MED ORDER — MIDAZOLAM HCL 2 MG/2ML IJ SOLN
INTRAMUSCULAR | Status: DC | PRN
  Administered 2017-05-24: 2 mg via INTRAVENOUS

## 2017-05-24 MED ORDER — FENTANYL CITRATE (PF) 100 MCG/2ML IJ SOLN
INTRAMUSCULAR | Status: DC | PRN
  Administered 2017-05-24 (×4): 25 ug via INTRAVENOUS

## 2017-05-24 MED ORDER — ONDANSETRON HCL 4 MG/2ML IJ SOLN: 4.0000 mg | Freq: Once | INTRAMUSCULAR | Status: DC | PRN

## 2017-05-24 MED ORDER — ACETAMINOPHEN 10 MG/ML IV SOLN
INTRAVENOUS | Status: AC
  Filled 2017-05-24: qty 100

## 2017-05-24 MED ORDER — MIDAZOLAM HCL 2 MG/2ML IJ SOLN
INTRAMUSCULAR | Status: AC
  Filled 2017-05-24: qty 2

## 2017-05-24 MED ORDER — BUPIVACAINE HCL (PF) 0.5 % IJ SOLN
INTRAMUSCULAR | Status: AC
  Filled 2017-05-24: qty 30

## 2017-05-24 MED ORDER — FENTANYL CITRATE (PF) 100 MCG/2ML IJ SOLN
INTRAMUSCULAR | Status: AC
  Filled 2017-05-24: qty 2

## 2017-05-24 MED ORDER — DEXAMETHASONE SODIUM PHOSPHATE 10 MG/ML IJ SOLN
INTRAMUSCULAR | Status: DC | PRN
  Administered 2017-05-24: 5 mg via INTRAVENOUS

## 2017-05-24 MED ORDER — FENTANYL CITRATE (PF) 100 MCG/2ML IJ SOLN: 25.0000 ug | INTRAMUSCULAR | Status: DC | PRN

## 2017-05-24 SURGICAL SUPPLY — 22 items
BANDAGE ACE 3X5.8 VEL STRL LF (GAUZE/BANDAGES/DRESSINGS) ×2 IMPLANT
CANISTER SUCT 1200ML W/VALVE (MISCELLANEOUS) ×2 IMPLANT
CHLORAPREP W/TINT 26ML (MISCELLANEOUS) ×2 IMPLANT
CUFF TOURN 18 STER (MISCELLANEOUS) ×2 IMPLANT
ELECT CAUTERY NEEDLE 2.0 MIC (NEEDLE) IMPLANT
GAUZE PETRO XEROFOAM 1X8 (MISCELLANEOUS) ×2 IMPLANT
GAUZE SPONGE 4X4 12PLY STRL (GAUZE/BANDAGES/DRESSINGS) ×2 IMPLANT
GLOVE SURG SYN 9.0  PF PI (GLOVE) ×1
GLOVE SURG SYN 9.0 PF PI (GLOVE) ×1 IMPLANT
GOWN SRG 2XL LVL 4 RGLN SLV (GOWNS) ×1 IMPLANT
GOWN STRL NON-REIN 2XL LVL4 (GOWNS) ×1
GOWN STRL REUS W/ TWL LRG LVL3 (GOWN DISPOSABLE) ×1 IMPLANT
GOWN STRL REUS W/TWL LRG LVL3 (GOWN DISPOSABLE) ×1
KIT TURNOVER KIT A (KITS) ×2 IMPLANT
NS IRRIG 500ML POUR BTL (IV SOLUTION) ×2 IMPLANT
PACK EXTREMITY ARMC (MISCELLANEOUS) ×2 IMPLANT
PAD CAST CTTN 4X4 STRL (SOFTGOODS) ×1 IMPLANT
PADDING CAST COTTON 4X4 STRL (SOFTGOODS) ×1
SCALPEL PROTECTED #15 DISP (BLADE) ×4 IMPLANT
SUT ETHILON 4-0 (SUTURE) ×1
SUT ETHILON 4-0 FS2 18XMFL BLK (SUTURE) ×1
SUTURE ETHLN 4-0 FS2 18XMF BLK (SUTURE) ×1 IMPLANT

## 2017-05-24 NOTE — OR Nursing (Signed)
Discussed discharge instructions with pt and family. All voiced understanding

## 2017-05-24 NOTE — Op Note (Signed)
05/24/2017  3:34 PM  PATIENT:  Sara Greene  77 y.o. female  PRE-OPERATIVE DIAGNOSIS:  right carpal tunnel syndrome  POST-OPERATIVE DIAGNOSIS:  right carpal tunnel syndrome  PROCEDURE:  Procedure(s): CARPAL TUNNEL RELEASE (Right)  SURGEON: Laurene Footman, MD  ASSISTANTS: None  ANESTHESIA:   general  EBL:  Total I/O In: 900 [I.V.:900] Out: -   BLOOD ADMINISTERED:none  DRAINS: none   LOCAL MEDICATIONS USED:  MARCAINE     SPECIMEN:  No Specimen  DISPOSITION OF SPECIMEN:  N/A  COUNTS:  YES  TOURNIQUET:   Total Tourniquet Time Documented: Upper Arm (Right) - 12 minutes Total: Upper Arm (Right) - 12 minutes   IMPLANTS: None  DICTATION: .Dragon Dictation patient brought the operating room and after adequate anesthesia was obtained the right arm was prepped and draped in the sterile fashion.  After patient identification and timeout procedures were completed, tourniquet was raised.  Incision was made in line with the ring metacarpal and across the wrist flexion crease to get into the distal forearm.  Subtenons tissue was spread and the transverse carpal ligament identified and opened with a vascular hemostat placed deep to protect the underlying structures.  Release was carried out distally until fat was noted around the nerve and then proximally going about 2 cm proximal to the wrist flexion crease there was a tight band until that point after release of this level there was no longer a tight band noted around the nerve and there is good vascular blush to the nerve it appeared that all constriction and been relieved.  The wound was then irrigated and 10 cc of half percent Sensorcaine plain was infiltrated around the incision for postop analgesia.  The wound was closed with simple interrupted 4-0 nylon skin suture followed by Xeroform 4 x 4's web roll and Ace wrap  PLAN OF CARE: Discharge to home after PACU  PATIENT DISPOSITION:  PACU - hemodynamically stable.

## 2017-05-24 NOTE — H&P (Signed)
Reviewed paper H+P, will be scanned into chart. No changes noted.  

## 2017-05-24 NOTE — Transfer of Care (Signed)
Immediate Anesthesia Transfer of Care Note  Patient: Sara Greene  Procedure(s) Performed: CARPAL TUNNEL RELEASE (Right Wrist)  Patient Location: PACU  Anesthesia Type:General  Level of Consciousness: sedated  Airway & Oxygen Therapy: Patient Spontanous Breathing and Patient connected to nasal cannula oxygen  Post-op Assessment: Report given to RN and Post -op Vital signs reviewed and stable  Post vital signs: stable  Last Vitals:  Vitals:   05/24/17 1303 05/24/17 1541  BP: (!) 144/97   Pulse: 84   Resp: 18   Temp: 37 C (P) 36.9 C  SpO2: 98%     Last Pain:  Vitals:   05/24/17 1541  TempSrc:   PainSc: (P) 0-No pain         Complications: No apparent anesthesia complications

## 2017-05-24 NOTE — Anesthesia Procedure Notes (Signed)
Procedure Name: LMA Insertion Date/Time: 05/24/2017 3:08 PM Performed by: Dionne Bucy, CRNA Pre-anesthesia Checklist: Patient identified, Patient being monitored, Timeout performed, Emergency Drugs available and Suction available Patient Re-evaluated:Patient Re-evaluated prior to induction Oxygen Delivery Method: Circle system utilized Preoxygenation: Pre-oxygenation with 100% oxygen Induction Type: IV induction Ventilation: Mask ventilation without difficulty LMA: LMA inserted LMA Size: 3.5 Tube type: Oral Number of attempts: 1 Placement Confirmation: positive ETCO2 and breath sounds checked- equal and bilateral Tube secured with: Tape Dental Injury: Teeth and Oropharynx as per pre-operative assessment

## 2017-05-24 NOTE — Anesthesia Post-op Follow-up Note (Signed)
Anesthesia QCDR form completed.        

## 2017-05-24 NOTE — Discharge Instructions (Addendum)
AMBULATORY SURGERY  DISCHARGE INSTRUCTIONS   1) The drugs that you were given will stay in your system until tomorrow so for the next 24 hours you should not:  A) Drive an automobile B) Make any legal decisions C) Drink any alcoholic beverage   2) You may resume regular meals tomorrow.  Today it is better to start with liquids and gradually work up to solid foods.  You may eat anything you prefer, but it is better to start with liquids, then soup and crackers, and gradually work up to solid foods.   3) Please notify your doctor immediately if you have any unusual bleeding, trouble breathing, redness and pain at the surgery site, drainage, fever, or pain not relieved by medication. 4)   5) Your post-operative visit with Dr.                                     is: Date:                        Time:    Please call to schedule your post-operative visit.  6) Additional Instructions:      Work fingers as much as you can.  Your hand will be numb for 6-8 hours after leaving because of local anesthetic that has been placed that is normal.  Loosen Ace wrap if fingers swell.  Keep dressing clean and dry.  Take usual medications

## 2017-05-24 NOTE — Anesthesia Preprocedure Evaluation (Signed)
Anesthesia Evaluation  Patient identified by MRN, date of birth, ID band Patient awake    Reviewed: Allergy & Precautions, NPO status , Patient's Chart, lab work & pertinent test results  History of Anesthesia Complications Negative for: history of anesthetic complications  Airway Mallampati: II       Dental  (+) Upper Dentures, Lower Dentures   Pulmonary neg COPD,           Cardiovascular hypertension, Pt. on medications (-) CAD, (-) Past MI, (-) Cardiac Stents, (-) CABG and (-) CHF + dysrhythmias (hx of afib in SR since admissson) Atrial Fibrillation (-) Valvular Problems/Murmurs     Neuro/Psych neg Seizures PSYCHIATRIC DISORDERS Depression    GI/Hepatic Neg liver ROS, GERD  Medicated and Controlled,  Endo/Other  neg diabetesHypothyroidism   Renal/GU negative Renal ROS     Musculoskeletal   Abdominal   Peds  Hematology   Anesthesia Other Findings Past Medical History: No date: Anemia No date: Arthritis No date: Atrial fibrillation ([facility]) No date: Cancer ([place])     Comment:  left breast lumpectomy No date: Chronic pain No date: Chronic urethral narrowing     Comment:  please use the smallest foley catheter available for her No date: Depression No date: Dysrhythmia     Comment:  a fib. per patient, dr. lithicum could not confirm this               diagnosis No date: GERD (gastroesophageal reflux disease) No date: Hyperlipidemia No date: Hypertension No date: Hypokalemia No date: Hyponatremia No date: Hypothyroidism No date: Osteoporosis   Reproductive/Obstetrics                             Anesthesia Physical  Anesthesia Plan  ASA: III  Anesthesia Plan: General   Post-op Pain Management:    Induction: Intravenous  PONV Risk Score and Plan: 2  Airway Management Planned: LMA  Additional Equipment:   Intra-op Plan:   Post-operative Plan: Extubation in OR  Informed  Consent: I have reviewed the patients History and Physical, chart, labs and discussed the procedure including the risks, benefits and alternatives for the proposed anesthesia with the patient or authorized representative who has indicated his/her understanding and acceptance.     Plan Discussed with: Anesthesiologist, CRNA and Surgeon  Anesthesia Plan Comments:         Anesthesia Quick Evaluation

## 2017-05-25 ENCOUNTER — Encounter: Payer: Self-pay | Admitting: Orthopedic Surgery

## 2017-05-25 NOTE — Anesthesia Postprocedure Evaluation (Signed)
Anesthesia Post Note  Patient: Sales promotion account executive  Procedure(s) Performed: CARPAL TUNNEL RELEASE (Right Wrist)  Patient location during evaluation: PACU Anesthesia Type: General Level of consciousness: awake and alert Pain management: pain level controlled Vital Signs Assessment: post-procedure vital signs reviewed and stable Respiratory status: spontaneous breathing, nonlabored ventilation, respiratory function stable and patient connected to nasal cannula oxygen Cardiovascular status: blood pressure returned to baseline and stable Postop Assessment: no apparent nausea or vomiting Anesthetic complications: no     Last Vitals:  Vitals:   05/24/17 1622 05/24/17 1642  BP: (!) 174/72 (!) 161/76  Pulse: 68   Resp: 16   Temp: (!) 36.3 C   SpO2: 99%     Last Pain:  Vitals:   05/25/17 0853  TempSrc:   PainSc: 2                  Martha Clan

## 2019-01-25 ENCOUNTER — Other Ambulatory Visit: Payer: Self-pay | Admitting: *Deleted

## 2019-01-25 DIAGNOSIS — Z20822 Contact with and (suspected) exposure to covid-19: Secondary | ICD-10-CM

## 2019-01-26 LAB — NOVEL CORONAVIRUS, NAA: SARS-CoV-2, NAA: NOT DETECTED

## 2019-11-13 ENCOUNTER — Other Ambulatory Visit: Payer: Self-pay | Admitting: Physician Assistant

## 2019-11-13 DIAGNOSIS — S32010A Wedge compression fracture of first lumbar vertebra, initial encounter for closed fracture: Secondary | ICD-10-CM

## 2019-11-30 ENCOUNTER — Ambulatory Visit
Admission: RE | Admit: 2019-11-30 | Discharge: 2019-11-30 | Disposition: A | Discharge status: Home / Self Care | Payer: Medicare Other | Source: Ambulatory Visit | Attending: Physician Assistant | Admitting: Physician Assistant

## 2019-11-30 DIAGNOSIS — S32010A Wedge compression fracture of first lumbar vertebra, initial encounter for closed fracture: Secondary | ICD-10-CM

## 2022-05-08 ENCOUNTER — Other Ambulatory Visit: Payer: Self-pay | Admitting: Podiatry

## 2022-05-12 ENCOUNTER — Other Ambulatory Visit: Payer: Self-pay

## 2022-05-12 ENCOUNTER — Encounter
Admission: RE | Admit: 2022-05-12 | Discharge: 2022-05-12 | Disposition: A | Discharge status: Home / Self Care | Payer: Medicare Other | Source: Ambulatory Visit | Attending: Podiatry | Admitting: Podiatry

## 2022-05-12 DIAGNOSIS — Z01812 Encounter for preprocedural laboratory examination: Secondary | ICD-10-CM

## 2022-05-12 HISTORY — DX: Other complications of anesthesia, initial encounter: T88.59XA

## 2022-05-12 HISTORY — DX: Prediabetes: R73.03

## 2022-05-12 NOTE — Patient Instructions (Addendum)
Your procedure is scheduled on: 05/19/22 - Friday Report to the Registration Desk on the 1st floor of the [facility]. To find out your arrival time, please call [phone] between 1PM - 3PM on: 05/18/22 - Thursday If your arrival time is 6:00 am, do not arrive before that time as the [facility] entrance doors do not open until 6:00 am.  REMEMBER: Instructions that are not followed completely may result in serious medical risk, up to and including death; or upon the discretion of your surgeon and anesthesiologist your surgery may need to be rescheduled.  Do not eat food after midnight the night before surgery.  No gum chewing or hard candies.  You may however, drink CLEAR liquids up to 2 hours before you are scheduled to arrive for your surgery. Do not drink anything within 2 hours of your scheduled arrival time.  Clear liquids include: - water  - apple juice without pulp - gatorade (not RED colors) - black coffee or tea (Do NOT add milk or creamers to the coffee or tea) Do NOT drink anything that is not on this list.  In addition, your doctor has ordered for you to drink the provided:  Ensure Pre-Surgery Clear Carbohydrate Drink  Drinking this carbohydrate drink up to two hours before surgery helps to reduce insulin resistance and improve patient outcomes. Please complete drinking 2 hours before scheduled arrival time.  One week prior to surgery: Stop Anti-inflammatories (NSAIDS) such as Advil, Aleve, Ibuprofen, Motrin, Naproxen, Naprosyn and Aspirin based products such as Excedrin, Goody's Powder, BC Powder.  Stop ANY OVER THE COUNTER supplements until after surgery.  You may however, continue to take Tylenol if needed for pain up until the day of surgery.   TAKE ONLY THESE MEDICATIONS THE MORNING OF SURGERY WITH A SIP OF WATER:  esomeprazole ([place])  (take one the night before and one on the morning of surgery - helps to prevent nausea after surgery.) gabapentin  (NEURONTIN)  levothyroxine (SYNTHROID, LEVOTHROID)  morphine (MS CONTIN)  oxybutynin (DITROPAN)  PARoxetine (PAXIL)  potassium chloride (K-DUR)    No Alcohol for 24 hours before or after surgery.  No Smoking including e-cigarettes for 24 hours before surgery.  No chewable tobacco products for at least 6 hours before surgery.  No nicotine patches on the day of surgery.  Do not use any "recreational" drugs for at least a week (preferably 2 weeks) before your surgery.  Please be advised that the combination of cocaine and anesthesia may have negative outcomes, up to and including death. If you test positive for cocaine, your surgery will be cancelled.  On the morning of surgery brush your teeth with toothpaste and water, you may rinse your mouth with mouthwash if you wish. Do not swallow any toothpaste or mouthwash.  Use CHG Soap or wipes as directed on instruction sheet.  Do not wear jewelry, make-up, hairpins, clips or nail polish.  Do not wear lotions, powders, or perfumes.   Do not shave body hair from the neck down 48 hours before surgery.  Contact lenses, hearing aids and dentures may not be worn into surgery.  Do not bring valuables to the hospital. [facility] is not responsible for any missing/lost belongings or valuables.   Notify your doctor if there is any change in your medical condition (cold, fever, infection).  Wear comfortable clothing (specific to your surgery type) to the hospital.  After surgery, you can help prevent lung complications by doing breathing exercises.  Take deep breaths  and cough every 1-2 hours. Your doctor may order a device called an Incentive Spirometer to help you take deep breaths. When coughing or sneezing, hold a pillow firmly against your incision with both hands. This is called "splinting." Doing this helps protect your incision. It also decreases belly discomfort.  If you are being admitted to the hospital overnight, leave your  suitcase in the car. After surgery it may be brought to your room.  In case of increased patient census, it may be necessary for you, the patient, to continue your postoperative care in the Same Day Surgery department.  If you are being discharged the day of surgery, you will not be allowed to drive home. You will need a responsible individual to drive you home and stay with you for 24 hours after surgery.   If you are taking public transportation, you will need to have a responsible individual with you.  Please call the [facility] Dept. at [phone] if you have any questions about these instructions.  Surgery Visitation Policy:  Patients undergoing a surgery or procedure may have two family members or support persons with them as long as the person is not COVID-19 positive or experiencing its symptoms.   Inpatient Visitation:    Visiting hours are 7 a.m. to 8 p.m. Up to four visitors are allowed at one time in a patient room. The visitors may rotate out with other people during the day. One designated support person (adult) may remain overnight.  Due to an increase in RSV and influenza rates and associated hospitalizations, children ages 39 and under will not be able to visit patients in [facility]. Masks continue to be strongly recommended.     Preparing for Surgery with CHLORHEXIDINE GLUCONATE (CHG) Soap  Chlorhexidine Gluconate (CHG) Soap  o An antiseptic cleaner that kills germs and bonds with the skin to continue killing germs even after washing  o Used for showering the night before surgery and morning of surgery  Before surgery, you can play an important role by reducing the number of germs on your skin.  CHG (Chlorhexidine gluconate) soap is an antiseptic cleanser which kills germs and bonds with the skin to continue killing germs even after washing.  Please do not use if you have an allergy to CHG or antibacterial soaps. If your skin becomes  reddened/irritated stop using the CHG.  1. Shower the NIGHT BEFORE SURGERY and the MORNING OF SURGERY with CHG soap.  2. If you choose to wash your hair, wash your hair first as usual with your normal shampoo.  3. After shampooing, rinse your hair and body thoroughly to remove the shampoo.  4. Use CHG as you would any other liquid soap. You can apply CHG directly to the skin and wash gently with a scrungie or a clean washcloth.  5. Apply the CHG soap to your body only from the neck down. Do not use on open wounds or open sores. Avoid contact with your eyes, ears, mouth, and genitals (private parts). Wash face and genitals (private parts) with your normal soap.  6. Wash thoroughly, paying special attention to the area where your surgery will be performed.  7. Thoroughly rinse your body with warm water.  8. Do not shower/wash with your normal soap after using and rinsing off the CHG soap.  9. Pat yourself dry with a clean towel.  10. Wear clean pajamas to bed the night before surgery.  12. Place clean sheets on your bed the  night of your first shower and do not sleep with pets.  13. Shower again with the CHG soap on the day of surgery prior to arriving at the hospital.  14. Do not apply any deodorants/lotions/powders.  15. Please wear clean clothes to the hospital.  How to Use an Incentive Spirometer  An incentive spirometer is a tool that measures how well you are filling your lungs with each breath. Learning to take long, deep breaths using this tool can help you keep your lungs clear and active. This may help to reverse or lessen your chance of developing breathing (pulmonary) problems, especially infection. You may be asked to use a spirometer: After a surgery. If you have a lung problem or a history of smoking. After a long period of time when you have been unable to move or be active. If the spirometer includes an indicator to show the highest number that you have reached,  your health care provider or respiratory therapist will help you set a goal. Keep a log of your progress as told by your health care provider. What are the risks? Breathing too quickly may cause dizziness or cause you to pass out. Take your time so you do not get dizzy or light-headed. If you are in pain, you may need to take pain medicine before doing incentive spirometry. It is harder to take a deep breath if you are having pain. How to use your incentive spirometer  Sit up on the edge of your bed or on a chair. Hold the incentive spirometer so that it is in an upright position. Before you use the spirometer, breathe out normally. Place the mouthpiece in your mouth. Make sure your lips are closed tightly around it. Breathe in slowly and as deeply as you can through your mouth, causing the piston or the ball to rise toward the top of the chamber. Hold your breath for 3-5 seconds, or for as long as possible. If the spirometer includes a coach indicator, use this to guide you in breathing. Slow down your breathing if the indicator goes above the marked areas. Remove the mouthpiece from your mouth and breathe out normally. The piston or ball will return to the bottom of the chamber. Rest for a few seconds, then repeat the steps 10 or more times. Take your time and take a few normal breaths between deep breaths so that you do not get dizzy or light-headed. Do this every 1-2 hours when you are awake. If the spirometer includes a goal marker to show the highest number you have reached (best effort), use this as a goal to work toward during each repetition. After each set of 10 deep breaths, cough a few times. This will help to make sure that your lungs are clear. If you have an incision on your chest or abdomen from surgery, place a pillow or a rolled-up towel firmly against the incision when you cough. This can help to reduce pain while taking deep breaths and coughing. General tips When you are  able to get out of bed: Walk around often. Continue to take deep breaths and cough in order to clear your lungs. Keep using the incentive spirometer until your health care provider says it is okay to stop using it. If you have been in the hospital, you may be told to keep using the spirometer at home. Contact a health care provider if: You are having difficulty using the spirometer. You have trouble using the spirometer as often as instructed.  Your pain medicine is not giving enough relief for you to use the spirometer as told. You have a fever. Get help right away if: You develop shortness of breath. You develop a cough with bloody mucus from the lungs. You have fluid or blood coming from an incision site after you cough. Summary An incentive spirometer is a tool that can help you learn to take long, deep breaths to keep your lungs clear and active. You may be asked to use a spirometer after a surgery, if you have a lung problem or a history of smoking, or if you have been inactive for a long period of time. Use your incentive spirometer as instructed every 1-2 hours while you are awake. If you have an incision on your chest or abdomen, place a pillow or a rolled-up towel firmly against your incision when you cough. This will help to reduce pain. Get help right away if you have shortness of breath, you cough up bloody mucus, or blood comes from your incision when you cough. This information is not intended to replace advice given to you by your health care provider. Make sure you discuss any questions you have with your health care provider. Document Revised: 05/26/2019 Document Reviewed: 05/26/2019 Elsevier Patient Education  [place].

## 2022-05-15 ENCOUNTER — Encounter
Admission: RE | Admit: 2022-05-15 | Discharge: 2022-05-15 | Disposition: A | Discharge status: Home / Self Care | Payer: Medicare Other | Source: Ambulatory Visit | Attending: Podiatry | Admitting: Podiatry

## 2022-05-15 DIAGNOSIS — Z01812 Encounter for preprocedural laboratory examination: Secondary | ICD-10-CM

## 2022-05-15 DIAGNOSIS — Z01810 Encounter for preprocedural cardiovascular examination: Secondary | ICD-10-CM

## 2022-05-15 DIAGNOSIS — Z01818 Encounter for other preprocedural examination: Secondary | ICD-10-CM | POA: Diagnosis present

## 2022-05-15 LAB — CBC
HCT: 32.9 % — ABNORMAL LOW (ref 36.0–46.0)
Hemoglobin: 10.7 g/dL — ABNORMAL LOW (ref 12.0–15.0)
MCH: 27.6 pg (ref 26.0–34.0)
MCHC: 32.5 g/dL (ref 30.0–36.0)
MCV: 85 fL (ref 80.0–100.0)
Platelets: 208 10*3/uL (ref 150–400)
RBC: 3.87 MIL/uL (ref 3.87–5.11)
RDW: 13.5 % (ref 11.5–15.5)
WBC: 6 10*3/uL (ref 4.0–10.5)
nRBC: 0 % (ref 0.0–0.2)

## 2022-05-15 LAB — BASIC METABOLIC PANEL
Anion gap: 8 (ref 5–15)
BUN: 16 mg/dL (ref 8–23)
CO2: 24 mmol/L (ref 22–32)
Calcium: 8.1 mg/dL — ABNORMAL LOW (ref 8.9–10.3)
Chloride: 100 mmol/L (ref 98–111)
Creatinine, Ser: 0.76 mg/dL (ref 0.44–1.00)
GFR, Estimated: >60 mL/min (ref >60)
Glucose, Bld: 127 mg/dL — ABNORMAL HIGH (ref 70–99)
Potassium: 4.4 mmol/L (ref 3.5–5.1)
Sodium: 132 mmol/L — ABNORMAL LOW (ref 135–145)

## 2022-05-18 MED ORDER — CHLORHEXIDINE GLUCONATE 0.12 % MT SOLN: 15.0000 mL | Freq: Once | OROMUCOSAL | Status: AC

## 2022-05-18 MED ORDER — ORAL CARE MOUTH RINSE: 15.0000 mL | Freq: Once | OROMUCOSAL | Status: AC

## 2022-05-18 MED ORDER — CEFAZOLIN SODIUM-DEXTROSE 2-4 GM/100ML-% IV SOLN
2.0000 g | INTRAVENOUS | Status: AC
  Administered 2022-05-19: 2 g via INTRAVENOUS

## 2022-05-18 MED ORDER — LACTATED RINGERS IV SOLN: INTRAVENOUS | Status: DC

## 2022-05-19 ENCOUNTER — Other Ambulatory Visit: Payer: Self-pay

## 2022-05-19 ENCOUNTER — Encounter: Payer: Self-pay | Admitting: Podiatry

## 2022-05-19 ENCOUNTER — Ambulatory Visit
Admission: RE | Admit: 2022-05-19 | Discharge: 2022-05-19 | Disposition: A | Discharge status: Home / Self Care | Payer: Medicare Other | Attending: Podiatry | Admitting: Podiatry

## 2022-05-19 ENCOUNTER — Ambulatory Visit: Payer: Medicare Other | Admitting: Urgent Care

## 2022-05-19 ENCOUNTER — Ambulatory Visit: Payer: Medicare Other

## 2022-05-19 ENCOUNTER — Ambulatory Visit: Payer: Medicare Other | Admitting: Certified Registered"

## 2022-05-19 ENCOUNTER — Encounter
Admission: RE | Disposition: A | Discharge status: Home / Self Care | Payer: Self-pay | Source: Home / Self Care | Attending: Podiatry

## 2022-05-19 DIAGNOSIS — Z79899 Other long term (current) drug therapy: Secondary | ICD-10-CM | POA: Insufficient documentation

## 2022-05-19 DIAGNOSIS — L97529 Non-pressure chronic ulcer of other part of left foot with unspecified severity: Secondary | ICD-10-CM | POA: Diagnosis not present

## 2022-05-19 DIAGNOSIS — M2012 Hallux valgus (acquired), left foot: Secondary | ICD-10-CM | POA: Insufficient documentation

## 2022-05-19 DIAGNOSIS — K219 Gastro-esophageal reflux disease without esophagitis: Secondary | ICD-10-CM | POA: Diagnosis not present

## 2022-05-19 DIAGNOSIS — M2042 Other hammer toe(s) (acquired), left foot: Secondary | ICD-10-CM | POA: Insufficient documentation

## 2022-05-19 DIAGNOSIS — I10 Essential (primary) hypertension: Secondary | ICD-10-CM | POA: Diagnosis not present

## 2022-05-19 HISTORY — PX: AMPUTATION TOE: SHX6595

## 2022-05-19 SURGERY — AMPUTATION, TOE
Anesthesia: Monitor Anesthesia Care | Site: Toe | Laterality: Left

## 2022-05-19 MED ORDER — OXYCODONE HCL 5 MG/5ML PO SOLN: 5.0000 mg | Freq: Once | ORAL | Status: DC | PRN

## 2022-05-19 MED ORDER — PROPOFOL 500 MG/50ML IV EMUL
INTRAVENOUS | Status: DC | PRN
  Administered 2022-05-19: 125 ug/kg/min via INTRAVENOUS

## 2022-05-19 MED ORDER — BUPIVACAINE LIPOSOME 1.3 % IJ SUSP
INTRAMUSCULAR | Status: AC
  Filled 2022-05-19: qty 10

## 2022-05-19 MED ORDER — PROMETHAZINE HCL 25 MG/ML IJ SOLN: 6.2500 mg | INTRAMUSCULAR | Status: DC | PRN

## 2022-05-19 MED ORDER — ONDANSETRON HCL 4 MG/2ML IJ SOLN: 4.0000 mg | Freq: Four times a day (QID) | INTRAMUSCULAR | Status: DC | PRN

## 2022-05-19 MED ORDER — BUPIVACAINE HCL (PF) 0.25 % IJ SOLN
INTRAMUSCULAR | Status: AC
  Filled 2022-05-19: qty 30

## 2022-05-19 MED ORDER — METOCLOPRAMIDE HCL 5 MG/ML IJ SOLN: 5.0000 mg | Freq: Three times a day (TID) | INTRAMUSCULAR | Status: DC | PRN

## 2022-05-19 MED ORDER — PROPOFOL 1000 MG/100ML IV EMUL
INTRAVENOUS | Status: AC
  Filled 2022-05-19: qty 100

## 2022-05-19 MED ORDER — GLYCOPYRROLATE 0.2 MG/ML IJ SOLN
INTRAMUSCULAR | Status: DC | PRN
  Administered 2022-05-19: .2 mg via INTRAVENOUS

## 2022-05-19 MED ORDER — ONDANSETRON HCL 4 MG PO TABS: 4.0000 mg | ORAL_TABLET | Freq: Four times a day (QID) | ORAL | Status: DC | PRN

## 2022-05-19 MED ORDER — ACETAMINOPHEN 10 MG/ML IV SOLN
INTRAVENOUS | Status: DC | PRN
  Administered 2022-05-19: 1000 mg via INTRAVENOUS

## 2022-05-19 MED ORDER — PROPOFOL 10 MG/ML IV BOLUS
INTRAVENOUS | Status: AC
  Filled 2022-05-19: qty 20

## 2022-05-19 MED ORDER — LIDOCAINE HCL (PF) 1 % IJ SOLN
INTRAMUSCULAR | Status: DC | PRN
  Administered 2022-05-19: 5 mL

## 2022-05-19 MED ORDER — FENTANYL CITRATE (PF) 100 MCG/2ML IJ SOLN: 25.0000 ug | INTRAMUSCULAR | Status: DC | PRN

## 2022-05-19 MED ORDER — PROPOFOL 10 MG/ML IV BOLUS
INTRAVENOUS | Status: DC | PRN
  Administered 2022-05-19: 30 mg via INTRAVENOUS
  Administered 2022-05-19: 50 mg via INTRAVENOUS

## 2022-05-19 MED ORDER — FENTANYL CITRATE (PF) 100 MCG/2ML IJ SOLN
INTRAMUSCULAR | Status: DC | PRN
  Administered 2022-05-19: 25 ug via INTRAVENOUS

## 2022-05-19 MED ORDER — 0.9 % SODIUM CHLORIDE (POUR BTL) OPTIME
TOPICAL | Status: DC | PRN
  Administered 2022-05-19: 1000 mL

## 2022-05-19 MED ORDER — FENTANYL CITRATE (PF) 100 MCG/2ML IJ SOLN
INTRAMUSCULAR | Status: AC
  Filled 2022-05-19: qty 2

## 2022-05-19 MED ORDER — DEXAMETHASONE SODIUM PHOSPHATE 10 MG/ML IJ SOLN
INTRAMUSCULAR | Status: DC | PRN
  Administered 2022-05-19: 10 mg via INTRAVENOUS

## 2022-05-19 MED ORDER — CEFAZOLIN SODIUM-DEXTROSE 2-4 GM/100ML-% IV SOLN
INTRAVENOUS | Status: AC
  Filled 2022-05-19: qty 100

## 2022-05-19 MED ORDER — DROPERIDOL 2.5 MG/ML IJ SOLN: 0.6250 mg | Freq: Once | INTRAMUSCULAR | Status: DC | PRN

## 2022-05-19 MED ORDER — CHLORHEXIDINE GLUCONATE 0.12 % MT SOLN
OROMUCOSAL | Status: AC
  Administered 2022-05-19: 15 mL via OROMUCOSAL
  Filled 2022-05-19: qty 15

## 2022-05-19 MED ORDER — ACETAMINOPHEN 10 MG/ML IV SOLN: 1000.0000 mg | Freq: Once | INTRAVENOUS | Status: DC | PRN

## 2022-05-19 MED ORDER — OXYCODONE HCL 5 MG PO TABS: 5.0000 mg | ORAL_TABLET | Freq: Once | ORAL | Status: DC | PRN

## 2022-05-19 MED ORDER — METOCLOPRAMIDE HCL 10 MG PO TABS: 5.0000 mg | ORAL_TABLET | Freq: Three times a day (TID) | ORAL | Status: DC | PRN

## 2022-05-19 MED ORDER — ONDANSETRON HCL 4 MG/2ML IJ SOLN
INTRAMUSCULAR | Status: DC | PRN
  Administered 2022-05-19: 4 mg via INTRAVENOUS

## 2022-05-19 MED ORDER — BUPIVACAINE HCL 0.5 % IJ SOLN
INTRAMUSCULAR | Status: DC | PRN
  Administered 2022-05-19: 5 mL

## 2022-05-19 SURGICAL SUPPLY — 53 items
BLADE OSC/SAGITTAL MD 5.5X18 (BLADE) ×1 IMPLANT
BLADE SURG MINI STRL (BLADE) ×1 IMPLANT
BNDG CMPR 5X4 CHSV STRCH STRL (GAUZE/BANDAGES/DRESSINGS) ×1
BNDG CMPR 75X21 PLY HI ABS (MISCELLANEOUS) ×1
BNDG CMPR STD VLCR NS LF 5.8X4 (GAUZE/BANDAGES/DRESSINGS)
BNDG COHESIVE 4X5 TAN STRL LF (GAUZE/BANDAGES/DRESSINGS) IMPLANT
BNDG ELASTIC 4X5.8 VLCR NS LF (GAUZE/BANDAGES/DRESSINGS) ×1 IMPLANT
BNDG ESMARCH 4 X 12 STRL LF (GAUZE/BANDAGES/DRESSINGS) ×1
BNDG ESMARCH 4X12 STRL LF (GAUZE/BANDAGES/DRESSINGS) ×1 IMPLANT
BNDG GAUZE DERMACEA FLUFF 4 (GAUZE/BANDAGES/DRESSINGS) ×1 IMPLANT
BNDG GZE 12X3 1 PLY HI ABS (GAUZE/BANDAGES/DRESSINGS)
BNDG GZE DERMACEA 4 6PLY (GAUZE/BANDAGES/DRESSINGS) ×1
BNDG STRETCH GAUZE 3IN X12FT (GAUZE/BANDAGES/DRESSINGS) ×2 IMPLANT
CUFF TOURN SGL QUICK 12 (TOURNIQUET CUFF) IMPLANT
CUFF TOURN SGL QUICK 18X4 (TOURNIQUET CUFF) IMPLANT
DRAPE FLUOR MINI C-ARM 54X84 (DRAPES) ×1 IMPLANT
DRAPE XRAY CASSETTE 23X24 (DRAPES) ×1 IMPLANT
DURAPREP 26ML APPLICATOR (WOUND CARE) ×1 IMPLANT
ELECT REM PT RETURN 9FT ADLT (ELECTROSURGICAL) ×1
ELECTRODE REM PT RTRN 9FT ADLT (ELECTROSURGICAL) ×1 IMPLANT
GAUZE PACKING IODOFORM 1/2INX (GAUZE/BANDAGES/DRESSINGS) ×1 IMPLANT
GAUZE SPONGE 4X4 12PLY STRL (GAUZE/BANDAGES/DRESSINGS) ×1 IMPLANT
GAUZE STRETCH 2X75IN STRL (MISCELLANEOUS) ×1 IMPLANT
GAUZE XEROFORM 1X8 LF (GAUZE/BANDAGES/DRESSINGS) ×1 IMPLANT
GLOVE BIO SURGEON STRL SZ7.5 (GLOVE) ×1 IMPLANT
GLOVE SURG UNDER LTX SZ8 (GLOVE) ×1 IMPLANT
GOWN STRL REUS W/ TWL XL LVL3 (GOWN DISPOSABLE) ×2 IMPLANT
GOWN STRL REUS W/TWL XL LVL3 (GOWN DISPOSABLE) ×2
IV NS IRRIG 3000ML ARTHROMATIC (IV SOLUTION) ×1 IMPLANT
KIT TURNOVER KIT A (KITS) ×1 IMPLANT
LABEL OR SOLS (LABEL) ×1 IMPLANT
MANIFOLD NEPTUNE II (INSTRUMENTS) ×1 IMPLANT
NDL FILTER BLUNT 18X1 1/2 (NEEDLE) ×1 IMPLANT
NDL HYPO 25X1 1.5 SAFETY (NEEDLE) ×1 IMPLANT
NEEDLE FILTER BLUNT 18X1 1/2 (NEEDLE) ×1 IMPLANT
NEEDLE HYPO 25X1 1.5 SAFETY (NEEDLE) ×1 IMPLANT
NS IRRIG 500ML POUR BTL (IV SOLUTION) ×1 IMPLANT
PACK EXTREMITY ARMC (MISCELLANEOUS) ×1 IMPLANT
PAD ABD DERMACEA PRESS 5X9 (GAUZE/BANDAGES/DRESSINGS) ×2 IMPLANT
PULSAVAC PLUS IRRIG FAN TIP (DISPOSABLE)
SHIELD FULL FACE ANTIFOG 7M (MISCELLANEOUS) ×1 IMPLANT
STOCKINETTE M/LG 89821 (MISCELLANEOUS) ×1 IMPLANT
STRAP SAFETY 5IN WIDE (MISCELLANEOUS) ×1 IMPLANT
SUT ETHILON 3-0 FS-10 30 BLK (SUTURE) ×1
SUT ETHILON 5-0 FS-2 18 BLK (SUTURE) ×1 IMPLANT
SUT VIC AB 3-0 SH 27 (SUTURE) ×1
SUT VIC AB 3-0 SH 27X BRD (SUTURE) IMPLANT
SUT VIC AB 4-0 FS2 27 (SUTURE) ×1 IMPLANT
SUTURE EHLN 3-0 FS-10 30 BLK (SUTURE) ×1 IMPLANT
SYR 10ML LL (SYRINGE) ×3 IMPLANT
TIP FAN IRRIG PULSAVAC PLUS (DISPOSABLE) ×1 IMPLANT
TRAP FLUID SMOKE EVACUATOR (MISCELLANEOUS) ×1 IMPLANT
WATER STERILE IRR 500ML POUR (IV SOLUTION) ×1 IMPLANT

## 2022-05-19 NOTE — Anesthesia Procedure Notes (Addendum)
Procedure Name: General with mask airway Date/Time: 05/19/2022 10:54 AM  Performed by: Kelton Pillar, CRNAPre-anesthesia Checklist: Patient identified, Emergency Drugs available, Suction available and Patient being monitored Patient Re-evaluated:Patient Re-evaluated prior to induction Oxygen Delivery Method: Simple face mask Induction Type: IV induction Ventilation: Oral airway inserted - appropriate to patient size Placement Confirmation: positive ETCO2, CO2 detector and breath sounds checked- equal and bilateral Dental Injury: Teeth and Oropharynx as per pre-operative assessment

## 2022-05-19 NOTE — Anesthesia Postprocedure Evaluation (Signed)
Anesthesia Post Note  Patient: Sales promotion account executive  Procedure(s) Performed: TOE METATARSOPHALANGEAL SECOND (Left: Toe)  Patient location during evaluation: PACU Anesthesia Type: MAC Level of consciousness: awake and alert Pain management: pain level controlled Vital Signs Assessment: post-procedure vital signs reviewed and stable Respiratory status: spontaneous breathing, nonlabored ventilation, respiratory function stable and patient connected to nasal cannula oxygen Cardiovascular status: blood pressure returned to baseline and stable Postop Assessment: no apparent nausea or vomiting Anesthetic complications: no   No notable events documented.   Last Vitals:  Vitals:   05/19/22 1145 05/19/22 1216  BP: (!) 127/93 (!) 160/84  Pulse: 92 90  Resp: 16 18  Temp:  36.8 C  SpO2: 100% 100%    Last Pain:  Vitals:   05/19/22 1216  TempSrc: Temporal  PainSc: 0-No pain                 Molli Barrows

## 2022-05-19 NOTE — Op Note (Signed)
Operative note   Surgeon:Tait Balistreri Lawyer: None    Preop diagnosis: Hammertoe with ulcer left second toe    Postop diagnosis: Same    Procedure: Amputation left second toe metatarsophalangeal joint    EBL: Minimal    Anesthesia:local and IV sedation    Hemostasis: [place] tourniquet inflated to 200 mmHg for 3 minutes    Specimen: Amputated left second toe with ulceration    Complications: None    Operative indications:Sara Greene is an 82 y.o. that presents today for surgical intervention.  The risks/benefits/alternatives/complications have been discussed and consent has been given.    Procedure:  Patient was brought into the OR and placed on the operating table in thesupine position. After anesthesia was obtained theleft lower extremity was prepped and draped in usual sterile fashion.  Attention was directed to the left second toe that had an overlapping hammertoe.  2 full-thickness semielliptical incisions were made around the metatarsophalangeal joint region.  The toe was disarticulated.  All bleeders were Bovie cauterized.  The tourniquet was released.  Closure was then performed with a 3-0 Vicryl for the subcutaneous tissue and a 3-0 nylon for the skin.  A large bulky dressing was applied.    Patient tolerated the procedure and anesthesia well.  Was transported from the OR to the PACU with all vital signs stable and vascular status intact. To be discharged per routine protocol.  Will follow up in approximately 1 week in the outpatient clinic.

## 2022-05-19 NOTE — H&P (Signed)
HISTORY AND PHYSICAL INTERVAL NOTE:  05/19/2022  10:19 AM  Sara Greene  has presented today for surgery, with the diagnosis of M20.42 - Hammer toe of left foot M20.12 - Hallux valgus of left foot.  The various methods of treatment have been discussed with the patient.  No guarantees were given.  After consideration of risks, benefits and other options for treatment, the patient has consented to surgery.  I have reviewed the patients' chart and labs.     A history and physical examination was performed in my office.  The patient was reexamined.  There have been no changes to this history and physical examination.  Sara Greene A

## 2022-05-19 NOTE — Transfer of Care (Signed)
Immediate Anesthesia Transfer of Care Note  Patient: Patrizia Cowley  Procedure(s) Performed: TOE METATARSOPHALANGEAL SECOND (Left: Toe)  Patient Location: PACU  Anesthesia Type:General  Level of Consciousness: drowsy and patient cooperative  Airway & Oxygen Therapy: Patient Spontanous Breathing and Patient connected to face mask oxygen  Post-op Assessment: Report given to RN and Post -op Vital signs reviewed and stable  Post vital signs: Reviewed and stable  Last Vitals:  Vitals Value Taken Time  BP 73/55 05/19/22 1110  Temp    Pulse 87 05/19/22 1114  Resp 12 05/19/22 1114  SpO2 100 % 05/19/22 1114  Vitals shown include unvalidated device data.  Last Pain:  Vitals:   05/19/22 0947  TempSrc: Temporal  PainSc: 3          Complications: No notable events documented.

## 2022-05-19 NOTE — Discharge Instructions (Addendum)
[place]  POST OPERATIVE INSTRUCTIONS FOR DR. Vickki Muff AND DR. [place]   Take your medication as prescribed.  Pain medication should be taken only as needed.  Keep the dressing clean, dry and intact.  Keep your foot elevated above the heart level for the first 48 hours.  Walking to the bathroom and brief periods of walking are acceptable, unless we have instructed you to be non-weight bearing.  Always wear your post-op shoe when walking.  Always use your crutches if you are to be non-weight bearing.  Do not take a shower. Baths are permissible as long as the foot is kept out of the water.   Every hour you are awake:  Bend your knee 15 times. Flex foot 15 times Massage calf 15 times  Call [facility] [phone]) if any of the following problems occur: You develop a temperature or fever. The bandage becomes saturated with blood. Medication does not stop your pain. Injury of the foot occurs.   Any symptoms of infection including redness, odor, or red streaks running from wound.  AMBULATORY SURGERY  DISCHARGE INSTRUCTIONS   The drugs that you were given will stay in your system until tomorrow so for the next 24 hours you should not:  Drive an automobile Make any legal decisions Drink any alcoholic beverage   You may resume regular meals tomorrow.  Today it is better to start with liquids and gradually work up to solid foods.  You may eat anything you prefer, but it is better to start with liquids, then soup and crackers, and gradually work up to solid foods.   Please notify your doctor immediately if you have any unusual bleeding, trouble breathing, redness and pain at the surgery site, drainage, fever, or pain not relieved by medication.    Additional Instructions:   Please contact your physician with any problems or Same Day Surgery at [phone], Monday through Friday 6 am to  4 pm, or [place] at [facility] number at [phone].

## 2022-05-19 NOTE — Anesthesia Preprocedure Evaluation (Signed)
Anesthesia Evaluation  Patient identified by MRN, date of birth, ID band Patient awake    Reviewed: Allergy & Precautions, H&P , NPO status , Patient's Chart, lab work & pertinent test results, reviewed documented beta blocker date and time   History of Anesthesia Complications (+) history of anesthetic complications  Airway Mallampati: II   Neck ROM: full    Dental  (+) Poor Dentition   Pulmonary neg pulmonary ROS   Pulmonary exam normal        Cardiovascular Exercise Tolerance: Poor hypertension, On Medications Normal cardiovascular exam+ dysrhythmias  Rhythm:regular Rate:Normal     Neuro/Psych  PSYCHIATRIC DISORDERS  Depression    negative neurological ROS     GI/Hepatic Neg liver ROS,GERD  Medicated,,  Endo/Other  Hypothyroidism    Renal/GU negative Renal ROS  negative genitourinary   Musculoskeletal   Abdominal   Peds  Hematology  (+) Blood dyscrasia, anemia   Anesthesia Other Findings Past Medical History: No date: Anemia No date: Arthritis No date: Atrial fibrillation (HCC) No date: Cancer ([place])     Comment:  left breast lumpectomy No date: Chronic pain 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 No date: Depression No date: Dysrhythmia     Comment:  a fib. per patient, dr. lithicum could not confirm this               diagnosis No date: GERD (gastroesophageal reflux disease) No date: Hyperlipidemia No date: Hypertension No date: Hypokalemia No date: Hyponatremia No date: Hypothyroidism No date: Osteoporosis No date: Pre-diabetes Past Surgical History: No date: APPENDECTOMY 1977: BACK SURGERY     Comment:  no metal in back, laminectomy 09/20/2015: BREAST BIOPSY; Left     Comment:   PATH PENDING yrs ago: BREAST CYST EXCISION; Left     Comment:  benign 05/24/2017: CARPAL TUNNEL RELEASE; Right     Comment:  Procedure: CARPAL TUNNEL RELEASE;  Surgeon: Hessie Knows, MD;  Location: [facility] ORS;  Service: Orthopedics;               Laterality: Right; No date: CHOLECYSTECTOMY No date: COLONOSCOPY 10/15/2014: COLONOSCOPY WITH PROPOFOL; N/A     Comment:  Procedure: COLONOSCOPY WITH PROPOFOL;  Surgeon: Hulen Luster, MD;  Location: [facility] ENDOSCOPY;  Service:               Gastroenterology;  Laterality: N/A; 2016: DENTAL SURGERY     Comment:  dental implants to hook dentures on 2002: FOOT SURGERY; Right     Comment:  screws in right foot 2002: fracture Rt arm; Right     Comment:  no metal 12/19/2016: INTRAMEDULLARY (IM) NAIL INTERTROCHANTERIC; Right     Comment:  Procedure: INTRAMEDULLARY (IM) NAIL INTERTROCHANTRIC;                Surgeon: Leim Fabry, MD;  Location: [facility] ORS;  Service:              Orthopedics;  Laterality: Right; No date: JOINT REPLACEMENT 1990: ROTATOR CUFF REPAIR; Left No date: TENDON REPAIR;  Bilateral 07/08/2015: TOTAL KNEE ARTHROPLASTY; Right     Comment:  Procedure: TOTAL KNEE ARTHROPLASTY;  Surgeon: Thornton Park, MD;  Location: [facility] ORS;  Service:               Orthopedics;  Laterality: Right; BMI    Body Mass Index: 23.17 kg/m     Reproductive/Obstetrics negative OB ROS                             Anesthesia Physical Anesthesia Plan  ASA: 3  Anesthesia Plan: MAC   Post-op Pain Management:    Induction:   PONV Risk Score and Plan: 3  Airway Management Planned:   Additional Equipment:   Intra-op Plan:   Post-operative Plan:   Informed Consent: I have reviewed the patients History and Physical, chart, labs and discussed the procedure including the risks, benefits and alternatives for the proposed anesthesia with the patient or authorized representative who has  indicated his/her understanding and acceptance.     Dental Advisory Given  Plan Discussed with: CRNA  Anesthesia Plan Comments:        Anesthesia Quick Evaluation

## 2022-05-20 ENCOUNTER — Encounter: Payer: Self-pay | Admitting: Podiatry

## 2022-05-23 LAB — SURGICAL PATHOLOGY

## 2023-03-10 ENCOUNTER — Emergency Department: Payer: Medicare Other

## 2023-03-10 ENCOUNTER — Emergency Department
Admission: EM | Admit: 2023-03-10 | Discharge: 2023-03-10 | Disposition: A | Discharge status: Home / Self Care | Payer: Medicare Other | Attending: Emergency Medicine | Admitting: Emergency Medicine

## 2023-03-10 ENCOUNTER — Encounter: Payer: Self-pay | Admitting: Emergency Medicine

## 2023-03-10 ENCOUNTER — Other Ambulatory Visit: Payer: Self-pay

## 2023-03-10 DIAGNOSIS — R55 Syncope and collapse: Secondary | ICD-10-CM | POA: Insufficient documentation

## 2023-03-10 DIAGNOSIS — B9689 Other specified bacterial agents as the cause of diseases classified elsewhere: Secondary | ICD-10-CM | POA: Diagnosis not present

## 2023-03-10 DIAGNOSIS — M7989 Other specified soft tissue disorders: Secondary | ICD-10-CM | POA: Insufficient documentation

## 2023-03-10 DIAGNOSIS — W19XXXA Unspecified fall, initial encounter: Secondary | ICD-10-CM | POA: Diagnosis not present

## 2023-03-10 DIAGNOSIS — N390 Urinary tract infection, site not specified: Secondary | ICD-10-CM | POA: Insufficient documentation

## 2023-03-10 DIAGNOSIS — S42034A Nondisplaced fracture of lateral end of right clavicle, initial encounter for closed fracture: Secondary | ICD-10-CM | POA: Insufficient documentation

## 2023-03-10 DIAGNOSIS — S52502A Unspecified fracture of the lower end of left radius, initial encounter for closed fracture: Secondary | ICD-10-CM | POA: Insufficient documentation

## 2023-03-10 DIAGNOSIS — I10 Essential (primary) hypertension: Secondary | ICD-10-CM | POA: Insufficient documentation

## 2023-03-10 DIAGNOSIS — E039 Hypothyroidism, unspecified: Secondary | ICD-10-CM | POA: Diagnosis not present

## 2023-03-10 DIAGNOSIS — S6992XA Unspecified injury of left wrist, hand and finger(s), initial encounter: Secondary | ICD-10-CM | POA: Diagnosis present

## 2023-03-10 LAB — URINALYSIS, ROUTINE W REFLEX MICROSCOPIC
Bilirubin Urine: NEGATIVE
Glucose, UA: NEGATIVE mg/dL
Hgb urine dipstick: NEGATIVE
Ketones, ur: NEGATIVE mg/dL
Nitrite: NEGATIVE
Protein, ur: 30 mg/dL — AB
Specific Gravity, Urine: 1.017 (ref 1.005–1.030)
WBC, UA: >50 WBC/hpf (ref 0–5)
pH: 5 (ref 5.0–8.0)

## 2023-03-10 LAB — BASIC METABOLIC PANEL
Anion gap: 9 (ref 5–15)
BUN: 7 mg/dL — ABNORMAL LOW (ref 8–23)
CO2: 23 mmol/L (ref 22–32)
Calcium: 8.2 mg/dL — ABNORMAL LOW (ref 8.9–10.3)
Chloride: 99 mmol/L (ref 98–111)
Creatinine, Ser: 0.75 mg/dL (ref 0.44–1.00)
GFR, Estimated: >60 mL/min (ref >60)
Glucose, Bld: 136 mg/dL — ABNORMAL HIGH (ref 70–99)
Potassium: 3.8 mmol/L (ref 3.5–5.1)
Sodium: 131 mmol/L — ABNORMAL LOW (ref 135–145)

## 2023-03-10 LAB — CBC
HCT: 38.1 % (ref 36.0–46.0)
Hemoglobin: 12 g/dL (ref 12.0–15.0)
MCH: 28 pg (ref 26.0–34.0)
MCHC: 31.5 g/dL (ref 30.0–36.0)
MCV: 89 fL (ref 80.0–100.0)
Platelets: 235 10*3/uL (ref 150–400)
RBC: 4.28 MIL/uL (ref 3.87–5.11)
RDW: 13.1 % (ref 11.5–15.5)
WBC: 8.5 10*3/uL (ref 4.0–10.5)
nRBC: 0 % (ref 0.0–0.2)

## 2023-03-10 MED ORDER — MORPHINE SULFATE (PF) 4 MG/ML IV SOLN
4.0000 mg | Freq: Once | INTRAVENOUS | Status: AC
Start: 2023-03-10 — End: 2023-03-10
  Administered 2023-03-10: 4 mg via INTRAVENOUS
  Filled 2023-03-10: qty 1

## 2023-03-10 MED ORDER — CEPHALEXIN 500 MG PO CAPS: 500.0000 mg | ORAL_CAPSULE | Freq: Two times a day (BID) | ORAL | 0 refills | Status: AC

## 2023-03-10 MED ORDER — CEPHALEXIN 500 MG PO CAPS
500.0000 mg | ORAL_CAPSULE | Freq: Once | ORAL | Status: AC
  Administered 2023-03-10: 500 mg via ORAL
  Filled 2023-03-10: qty 1

## 2023-03-10 MED ORDER — LIDOCAINE HCL (PF) 1 % IJ SOLN
10.0000 mL | Freq: Once | INTRAMUSCULAR | Status: AC
  Administered 2023-03-10: 10 mL
  Filled 2023-03-10: qty 10

## 2023-03-10 MED ORDER — MIDAZOLAM HCL 2 MG/2ML IJ SOLN
2.0000 mg | Freq: Once | INTRAMUSCULAR | Status: AC | PRN
  Administered 2023-03-10: 2 mg via INTRAVENOUS
  Filled 2023-03-10: qty 2

## 2023-03-10 NOTE — Discharge Instructions (Signed)
Take the antibiotic as prescribed and finish the full course.  Call to follow-up with Dr. Rexanne Mano from orthopedics this week.  Keep the splint on the left arm at all times.  Keep the sling on at all times on the right except when bathing or changing.  Return to the ER immediately for any new, worsening, or persistent symptoms including recurrent or worsening lightheadedness or episodes of passing out, fever, vomiting, weakness, numbness or severe pain in the arms, discoloration of the fingers, or any other new or worsening symptoms that concern you.  You may also return to the hospital at any time if you feel that you are not safe at home with the splint and sling on and need additional help.

## 2023-03-10 NOTE — ED Provider Notes (Signed)
[facility] Provider Note    Event Date/Time   First MD Initiated Contact with Patient 03/10/23 1357     (approximate)   History   Fall   HPI  Sara Greene is a 82 y.o. female with a history of atrial fibrillation, hypertension, hyperlipidemia, hypothyroidism, and osteoporosis who presents with a left wrist and right collarbone injury after a fall.  The patient states that she was going to turn off some lights this morning when she started to feel lightheaded, briefly lost consciousness and fell causing her to hurt her wrist and collarbone.  She denies hitting her head and has no other injuries.  She states that she otherwise had been feeling well before this incident.  She denies any urinary symptoms.  She denies any fever or chills.  She has no chest pain or difficulty breathing.  I reviewed the past medical records.  The patient's most recent outpatient encounter was with internal medicine on 11/19 for follow-up of her chronic conditions.  She has no recent ED visits or hospitalizations.   Physical Exam   Triage Vital Signs: ED Triage Vitals [03/10/23 1202]  Encounter Vitals Group     BP (!) 147/89     Systolic BP Percentile      Diastolic BP Percentile      Pulse Rate 86     Resp 18     Temp 98 F (36.7 C)     Temp Source Oral     SpO2 100 %     Weight 135 lb (61.2 kg)     Height      Head Circumference      Peak Flow      Pain Score 8     Pain Loc      Pain Education      Exclude from Growth Chart     Most recent vital signs: Vitals:   03/10/23 1202  BP: (!) 147/89  Pulse: 86  Resp: 18  Temp: 98 F (36.7 C)  SpO2: 100%     General: Awake, well-appearing, no distress.  CV:  Good peripheral perfusion.  Resp:  Normal effort.  Abd:  No distention.  Other:  EOMI.  PERRLA.  Normal speech.  No facial droop.  Motor intact in all extremities.  Left wrist deformity.  2+ radial pulse.  Motor and sensory intact distally in median, radial,  and ulnar distributions.  Right lateral clavicle with moderate tenderness.  No deformity.  No skin tenting.  Motor and sensory intact in the right upper extremity.   ED Results / Procedures / Treatments   Labs (all labs ordered are listed, but only abnormal results are displayed) Labs Reviewed  BASIC METABOLIC PANEL - Abnormal; Notable for the following components:      Result Value   Sodium 131 (*)    Glucose, Bld 136 (*)    BUN 7 (*)    Calcium 8.2 (*)    All other components within normal limits  URINALYSIS, ROUTINE W REFLEX MICROSCOPIC - Abnormal; Notable for the following components:   Color, Urine YELLOW (*)    APPearance CLOUDY (*)    Protein, ur 30 (*)    Leukocytes,Ua LARGE (*)    Bacteria, UA MANY (*)    All other components within normal limits  CBC     EKG  ED ECG REPORT I, Dionne Bucy, the attending physician, personally viewed and interpreted this ECG.  Date: 03/10/2023 EKG Time: 1238 Rate: 85 Rhythm:  normal sinus rhythm QRS Axis: normal Intervals: normal ST/T Wave abnormalities: normal Narrative Interpretation: no evidence of acute ischemia    RADIOLOGY  XR L wrist: I independently viewed and interpreted the images; there is an impacted distal radius fracture.  Radiology report also indicates possible scapholunate injury.  XR L wrist postreduction: Slight improvement in angulation  XR R shoulder: Lateral clavicle fracture  PROCEDURES:  Critical Care performed: No  .Ortho Injury Treatment  Date/Time: 03/10/2023 4:04 PM  Performed by: Dionne Bucy, MD Authorized by: Dionne Bucy, MD   Consent:    Consent obtained:  Verbal   Consent given by:  Patient   Risks discussed:  Fracture, nerve damage, vascular damage and stiffness   Alternatives discussed:  ImmobilizationInjury location: wrist Location details: left wrist Injury type: fracture Fracture type: distal radius Pre-procedure neurovascular assessment:  neurovascularly intact Anesthesia: hematoma block  Anesthesia: Local anesthesia used: yes Local Anesthetic: lidocaine 1% without epinephrine Anesthetic total: 5 mL  Patient sedated: NoImmobilization: splint Splint type: sugar tong Splint Applied by: ED Nurse Supplies used: Ortho-Glass Post-procedure neurovascular assessment: post-procedure neurovascularly intact      MEDICATIONS ORDERED IN ED: Medications  morphine (PF) 4 MG/ML injection 4 mg (has no administration in time range)  lidocaine (PF) (XYLOCAINE) 1 % injection 10 mL (10 mLs Other Given by Other 03/10/23 1520)  midazolam (VERSED) injection 2 mg (2 mg Intravenous Given 03/10/23 1517)  cephALEXin (KEFLEX) capsule 500 mg (500 mg Oral Given 03/10/23 1545)     IMPRESSION / MDM / ASSESSMENT AND PLAN / ED COURSE  I reviewed the triage vital signs and the nursing notes.  82 year old female with PMH as noted above presents with a left wrist injury and right collarbone injury after a fall; the patient states that she got lightheaded and briefly passed out causing her to fall.  Differential diagnosis includes, but is not limited to, vasovagal syncope, dehydration, electrolyte abnormality, UTI or other infection, less likely cardiac dysrhythmia.  Differential for the injuries includes fracture or contusion.  Patient's presentation is most consistent with acute presentation with potential threat to life or bodily function.  X-rays reveal a left distal radius fracture and a right lateral clavicle fracture.  I consulted Dr. Audelia Acton from orthopedics who reviewed the images and recommends a reduction and splint of the wrist and recommends placing the right upper extremity in a sling, with orthopedic follow-up this week.  BMP and CBC were obtained and are unremarkable.  Urinalysis shows significant WBCs and bacteria concerning for UTI.  ----------------------------------------- 4:45 PM on  03/10/2023 -----------------------------------------  The left distal radius was reduced.  We used a hematoma block as well as a low-dose of Versed for anxiolysis.  The patient did not require full sedation and tolerated the procedure well.  Repeat x-ray shows slight improvement in angulation.  A sling has been placed on the right arm.  I did consider whether the patient may benefit from inpatient admission given the UTI, syncope, as well as concerned that she may have difficulty functioning at home with a splint on her left arm in a sling on the right.  I offered the patient admission.  However, the patient expresses a strong preference to go home.  Her daughter is with her and feels comfortable taking her home and helping her with ADLs.  I counseled the patient on the results of the workup and plan of care.  She will follow-up with orthopedics this week.  I prescribed a course of Keflex for the UTI.  I gave strict return precautions and the patient and daughter expressed understanding.  I also advised that they may return at any time if the patient simply changes her mind or does not feel safe or adequately functional at home with the immobilization and needs additional help in the hospital.   FINAL CLINICAL IMPRESSION(S) / ED DIAGNOSES   Final diagnoses:  Closed fracture of distal end of left radius, unspecified fracture morphology, initial encounter  Closed nondisplaced fracture of acromial end of right clavicle, initial encounter  Syncope, unspecified syncope type  Urinary tract infection without hematuria, site unspecified     Rx / DC Orders   ED Discharge Orders          Ordered    cephALEXin (KEFLEX) 500 MG capsule  2 times daily        03/10/23 1643             Note:  This document was prepared using Dragon voice recognition software and may include unintentional dictation errors.    Dionne Bucy, MD 03/10/23 [id number]

## 2023-03-10 NOTE — ED Triage Notes (Addendum)
Pt presents for fall from dizziness whem turning too quickly on her porch. Injured L wrist, R collar bone, R shoulder.  Not on a blood thinner. Denies neck or head pain. A&Ox4 in triage.

## 2023-03-24 ENCOUNTER — Emergency Department: Payer: Medicare Other

## 2023-03-24 ENCOUNTER — Other Ambulatory Visit: Payer: Self-pay

## 2023-03-24 ENCOUNTER — Emergency Department
Admission: EM | Admit: 2023-03-24 | Discharge: 2023-03-24 | Disposition: A | Discharge status: Home / Self Care | Payer: Medicare Other | Attending: Emergency Medicine | Admitting: Emergency Medicine

## 2023-03-24 DIAGNOSIS — K5903 Drug induced constipation: Secondary | ICD-10-CM | POA: Insufficient documentation

## 2023-03-24 DIAGNOSIS — T391X5A Adverse effect of 4-Aminophenol derivatives, initial encounter: Secondary | ICD-10-CM | POA: Diagnosis not present

## 2023-03-24 DIAGNOSIS — K5900 Constipation, unspecified: Secondary | ICD-10-CM | POA: Diagnosis present

## 2023-03-24 LAB — COMPREHENSIVE METABOLIC PANEL
ALT: 20 U/L (ref 0–44)
AST: 38 U/L (ref 15–41)
Albumin: 3.7 g/dL (ref 3.5–5.0)
Alkaline Phosphatase: 137 U/L — ABNORMAL HIGH (ref 38–126)
Anion gap: 12 (ref 5–15)
BUN: 10 mg/dL (ref 8–23)
CO2: 26 mmol/L (ref 22–32)
Calcium: 8.5 mg/dL — ABNORMAL LOW (ref 8.9–10.3)
Chloride: 90 mmol/L — ABNORMAL LOW (ref 98–111)
Creatinine, Ser: 0.91 mg/dL (ref 0.44–1.00)
GFR, Estimated: >60 mL/min (ref >60)
Glucose, Bld: 125 mg/dL — ABNORMAL HIGH (ref 70–99)
Potassium: 3.8 mmol/L (ref 3.5–5.1)
Sodium: 128 mmol/L — ABNORMAL LOW (ref 135–145)
Total Bilirubin: 0.5 mg/dL (ref 0.0–1.2)
Total Protein: 7.2 g/dL (ref 6.5–8.1)

## 2023-03-24 MED ORDER — SODIUM CHLORIDE 0.9 % IV BOLUS
500.0000 mL | Freq: Once | INTRAVENOUS | Status: AC
Start: 2023-03-24 — End: 2023-03-24
  Administered 2023-03-24: 500 mL via INTRAVENOUS

## 2023-03-24 MED ORDER — POLYETHYLENE GLYCOL 3350 17 GM/SCOOP PO POWD: 1.0000 | Freq: Every day | ORAL | 0 refills | Status: DC

## 2023-03-24 MED ORDER — POLYETHYLENE GLYCOL 3350 17 GM/SCOOP PO POWD: 1.0000 | Freq: Every day | ORAL | 0 refills | Status: AC

## 2023-03-24 MED ORDER — POLYETHYLENE GLYCOL 3350 17 G PO PACK
34.0000 g | PACK | Freq: Once | ORAL | Status: AC
  Administered 2023-03-24: 34 g via ORAL
  Filled 2023-03-24: qty 2

## 2023-03-24 MED ORDER — IOHEXOL 300 MG/ML  SOLN
100.0000 mL | Freq: Once | INTRAMUSCULAR | Status: AC | PRN
  Administered 2023-03-24: 100 mL via INTRAVENOUS

## 2023-03-24 NOTE — ED Notes (Addendum)
 Pt states she's having "leaking stool" at this time due to enema, dulcolax, and magnesium citrate taken today- supplies to clean up provided to pt.

## 2023-03-24 NOTE — ED Provider Notes (Signed)
 [facility] Provider Note    Event Date/Time   First MD Initiated Contact with Patient 03/24/23 2038     (approximate)   History   Constipation   HPI  Sara Greene is a 83 y.o. female with history of constipation since 03/10/2023 after having a left arm fracture and right clavicle fracture.  She is taking Percocet in the last 10 years for back pain.  Patient states she has taking magnesium , MiraLAX , and enema.  Today patient has small bowel movement.  Patient has history of electrolytes imbalance.  Patient has not able to eat or drink anything today.      Physical Exam   Triage Vital Signs: ED Triage Vitals  Encounter Vitals Group     BP 03/24/23 1956 119/73     Systolic BP Percentile --      Diastolic BP Percentile --      Pulse Rate 03/24/23 1956 89     Resp 03/24/23 1956 18     Temp 03/24/23 1956 98.5 F (36.9 C)     Temp Source 03/24/23 1956 Oral     SpO2 03/24/23 1956 96 %     Weight 03/24/23 1954 135 lb (61.2 kg)     Height 03/24/23 1954 5' (1.524 m)     Head Circumference --      Peak Flow --      Pain Score 03/24/23 1954 0     Pain Loc --      Pain Education --      Exclude from Growth Chart --     Most recent vital signs: Vitals:   03/24/23 1956  BP: 119/73  Pulse: 89  Resp: 18  Temp: 98.5 F (36.9 C)  SpO2: 96%     Constitutional: Alert, oriented x 4 Eyes: Conjunctivae are normal.  Head: Atraumatic. Nose: No congestion/rhinnorhea. Mouth/Throat: Mucous membranes are moist.   Neck: Painless ROM.  Cardiovascular:   Good peripheral circulation. Respiratory: Normal respiratory effort.  No retractions.  Gastrointestinal: Soft and nontender.  Distended, tympanic, Murphy negative Rovsing negative, McBurney negative Musculoskeletal:  no deformity Neurologic:  MAE spontaneously. No gross focal neurologic deficits are appreciated.  Skin:  Skin is warm, dry and intact. No rash noted. Psychiatric: Mood and affect are normal.  Speech and behavior are normal.    ED Results / Procedures / Treatments   Labs (all labs ordered are listed, but only abnormal results are displayed) Labs Reviewed  COMPREHENSIVE METABOLIC PANEL - Abnormal; Notable for the following components:      Result Value   Sodium 128 (*)    Chloride 90 (*)    Glucose, Bld 125 (*)    Calcium  8.5 (*)    Alkaline Phosphatase 137 (*)    All other components within normal limits     EKG     RADIOLOGY I independently reviewed and interpreted imaging and agree with radiologists findings.      PROCEDURES:  Critical Care performed:   Procedures   MEDICATIONS ORDERED IN ED: Medications  polyethylene glycol (MIRALAX  / GLYCOLAX ) packet 34 g (has no administration in time range)  sodium chloride  0.9 % bolus 500 mL (0 mLs Intravenous Stopped 03/24/23 2227)  iohexol  (OMNIPAQUE ) 300 MG/ML solution 100 mL (100 mLs Intravenous Contrast Given 03/24/23 2244)     IMPRESSION / MDM / ASSESSMENT AND PLAN / ED COURSE  I reviewed the triage vital signs and the nursing notes.  Differential diagnosis includes, but is not limited to, constipation,  intestinal obstruction, colon cancer, side effect of Percocet  Patient's presentation is most consistent with acute complicated illness / injury requiring diagnostic workup.   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 I independently reviewed and interpreted imaging and agree with radiologists findings. Labs are rea reassuring. I did review the patient's allergies and medications. Patient will be discharged home with prescriptions for MiraLAX . Patient is to follow up with PCP as needed or otherwise directed. Patient is given ED precautions to return to the ED for any worsening or new symptoms. Discussed plan of care with patient, answered all of patient's questions, Patient agreeable to plan of care. Advised patient to take medications according to the instructions on the label. Discussed  possible side effects of new medications. Patient verbalized understanding. Clinical Course as of 03/24/23 2331  Sat Mar 24, 2023  2256 CT ABDOMEN PELVIS W CONTRAST [AE]  2323 CT ABDOMEN PELVIS W CONTRAST [AE]    Clinical Course User Index [AE] Janit Kast, PA-C     FINAL CLINICAL IMPRESSION(S) / ED DIAGNOSES   Final diagnoses:  Constipation due to pain medication     Rx / DC Orders   ED Discharge Orders          Ordered    polyethylene glycol powder (GLYCOLAX /MIRALAX ) 17 GM/SCOOP powder  Daily        03/24/23 2331             Note:  This document was prepared using Dragon voice recognition software and may include unintentional dictation errors.   Janit Kast, PA-C 03/24/23 2332    Claudene Rover, MD 03/25/23 [phone]

## 2023-03-24 NOTE — Discharge Instructions (Signed)
 You have been diagnosed with chronic constipation.  Please take MiraLAX as advised.  Please take plenty of fluids, Pedialyte to prevent hyponatremia.  Make an appointment with your PCP for follow-up.

## 2023-03-24 NOTE — ED Notes (Signed)
Pt taken by CT

## 2023-03-24 NOTE — ED Triage Notes (Addendum)
 Pt to ED via POV c/o constipation. Pt reports being constipated since 12/21. Has tried OTC medication and enema with no relief. Denies any abd pain Denies CP, SHOB, dizziness, fevers

## 2023-08-20 ENCOUNTER — Ambulatory Visit: Admitting: Urology

## 2023-12-19 ENCOUNTER — Other Ambulatory Visit: Payer: Self-pay | Admitting: Family Medicine

## 2023-12-19 DIAGNOSIS — Z1231 Encounter for screening mammogram for malignant neoplasm of breast: Secondary | ICD-10-CM

## 2024-01-10 ENCOUNTER — Ambulatory Visit
Admission: RE | Admit: 2024-01-10 | Discharge: 2024-01-10 | Disposition: A | Discharge status: Home / Self Care | Source: Ambulatory Visit | Attending: Family Medicine | Admitting: Family Medicine

## 2024-01-10 DIAGNOSIS — Z1231 Encounter for screening mammogram for malignant neoplasm of breast: Secondary | ICD-10-CM | POA: Insufficient documentation

## 2024-01-14 ENCOUNTER — Inpatient Hospital Stay
Admission: RE | Admit: 2024-01-14 | Discharge: 2024-01-14 | Disposition: A | Payer: Self-pay | Source: Ambulatory Visit | Attending: Family Medicine | Admitting: Family Medicine

## 2024-01-14 ENCOUNTER — Other Ambulatory Visit: Payer: Self-pay | Admitting: *Deleted

## 2024-01-14 ENCOUNTER — Inpatient Hospital Stay
Admission: RE | Admit: 2024-01-14 | Discharge: 2024-01-14 | Disposition: A | Discharge status: Home / Self Care | Payer: Self-pay | Source: Ambulatory Visit | Attending: Family Medicine | Admitting: Family Medicine

## 2024-01-14 DIAGNOSIS — Z1231 Encounter for screening mammogram for malignant neoplasm of breast: Secondary | ICD-10-CM
